# Patient Record
Sex: Female | Born: 1970 | Race: Black or African American | Hispanic: No | Marital: Married | Smoking: Never smoker
Health system: Southern US, Community
[De-identification: ages and names within clinical notes are randomized; demographics above are authoritative.]

## PROBLEM LIST (undated history)

## (undated) DIAGNOSIS — N390 Urinary tract infection, site not specified: Secondary | ICD-10-CM

## (undated) DIAGNOSIS — D649 Anemia, unspecified: Secondary | ICD-10-CM

## (undated) DIAGNOSIS — D259 Leiomyoma of uterus, unspecified: Secondary | ICD-10-CM

## (undated) DIAGNOSIS — Z800 Family history of malignant neoplasm of digestive organs: Secondary | ICD-10-CM

## (undated) DIAGNOSIS — O1490 Unspecified pre-eclampsia, unspecified trimester: Secondary | ICD-10-CM

## (undated) DIAGNOSIS — Z8041 Family history of malignant neoplasm of ovary: Secondary | ICD-10-CM

## (undated) HISTORY — DX: Family history of malignant neoplasm of digestive organs: Z80.0

## (undated) HISTORY — PX: PTOSIS REPAIR: SHX6568

## (undated) HISTORY — DX: Family history of malignant neoplasm of ovary: Z80.41

## (undated) HISTORY — PX: EYE SURGERY: SHX253

## (undated) HISTORY — PX: TONSILLECTOMY: SUR1361

---

## 2009-06-26 ENCOUNTER — Inpatient Hospital Stay (HOSPITAL_COMMUNITY): Admission: AD | Admit: 2009-06-26 | Discharge: 2009-06-26 | Payer: Self-pay | Admitting: Obstetrics & Gynecology

## 2009-06-29 ENCOUNTER — Ambulatory Visit (HOSPITAL_COMMUNITY): Admission: RE | Admit: 2009-06-29 | Discharge: 2009-06-29 | Payer: Self-pay | Admitting: Obstetrics & Gynecology

## 2009-07-28 ENCOUNTER — Ambulatory Visit (HOSPITAL_COMMUNITY): Admission: RE | Admit: 2009-07-28 | Discharge: 2009-07-28 | Payer: Self-pay | Admitting: Obstetrics & Gynecology

## 2009-08-04 ENCOUNTER — Ambulatory Visit: Payer: Self-pay | Admitting: Obstetrics and Gynecology

## 2009-08-04 ENCOUNTER — Encounter (INDEPENDENT_AMBULATORY_CARE_PROVIDER_SITE_OTHER): Payer: Self-pay | Admitting: Emergency Medicine

## 2009-08-04 ENCOUNTER — Emergency Department (HOSPITAL_COMMUNITY): Admission: EM | Admit: 2009-08-04 | Discharge: 2009-08-04 | Payer: Self-pay | Admitting: Emergency Medicine

## 2009-08-04 ENCOUNTER — Encounter: Payer: Self-pay | Admitting: Obstetrics & Gynecology

## 2009-08-04 ENCOUNTER — Ambulatory Visit: Payer: Self-pay | Admitting: Vascular Surgery

## 2009-08-06 ENCOUNTER — Ambulatory Visit (HOSPITAL_COMMUNITY): Admission: RE | Admit: 2009-08-06 | Discharge: 2009-08-06 | Payer: Self-pay | Admitting: Obstetrics & Gynecology

## 2009-08-29 ENCOUNTER — Inpatient Hospital Stay (HOSPITAL_COMMUNITY): Admission: AD | Admit: 2009-08-29 | Discharge: 2009-08-29 | Payer: Self-pay | Admitting: Obstetrics and Gynecology

## 2009-09-01 DIAGNOSIS — R079 Chest pain, unspecified: Secondary | ICD-10-CM

## 2009-09-01 DIAGNOSIS — I10 Essential (primary) hypertension: Secondary | ICD-10-CM | POA: Insufficient documentation

## 2009-09-09 ENCOUNTER — Ambulatory Visit: Payer: Self-pay | Admitting: Cardiology

## 2009-11-05 ENCOUNTER — Inpatient Hospital Stay (HOSPITAL_COMMUNITY): Admission: RE | Admit: 2009-11-05 | Discharge: 2009-11-08 | Payer: Self-pay | Admitting: Obstetrics & Gynecology

## 2010-05-25 NOTE — Assessment & Plan Note (Signed)
Summary: np6/[redacted] weeks pregnant/chest pain/htn/jml   Visit Type:  new pt visit Referring Provider:  Waynard Reeds Primary Provider:  Dr. Laurine Blazer .[facility] KitchenMar[facility]  CC:  chest pain....edema/ankles...sob due to [redacted] wks gestation.  History of Present Illness: [name] is referred today by Dr. Dareen Piano for evaluation of chest discomfort, lower extremity edema, and hypertension.  She is currently [redacted] weeks pregnant. She developed hypertension fairly early in pregnancy. She was treated with Procardia and then labetalol. She is currently on labetalol 800 mg 3 times a day and Procardia XL 30 mg a day. She says without Procardia her blood pressure went right back up.  She had some chest discomfort in the center of her chest with movement and with breathing back in April and early May. At that time she had an EKG and a chest x-ray. Looking at the handwritten notes I do not see any specific notes after that.  She had no fever, chills, productive cough or hemoptysis.  She's had progressive problems with lower extremity edema. She only developed the hypertension and the edema after being pregnant.  She does not smoke and does not drink heavily.  Preventive Screening-Counseling & Management  Alcohol-Tobacco     Smoking Status: never  Caffeine-Diet-Exercise     Does Patient Exercise: no      Drug Use:  no.    Current Medications (verified): 1)  Prenatal Vitamins 0.8 Mg Tabs (Prenatal Multivit-Min-Fe-Fa) .Marland Kitchen.. 1 Tab Once Daily 2)  Labetalol Hcl 200 Mg Tabs (Labetalol Hcl) .... 4 Tabs Three Times A Day 3)  Procardia Xl 30 Mg Xr24h-Tab (Nifedipine) .Marland Kitchen.. 1 Tab Once Daily  Allergies (verified): 1)  ! Codeine  Past History:  Past Medical History: Last updated: 09/01/2009 CHEST PAIN (ICD-786.50) HYPERTENSION (ICD-401.9)  Family History: Last updated: 09/01/2009 Fx h/o heart disease Family History of Hypertension:  Mother: anemia Family History of Diabetes:  Fx h/o thyroid  disfunction Fx h/o seizure disorder Family History of Cancer:  Fx h/o psychiatric disease  Social History: Last updated: 09/09/2009 Married  Alcohol Use - yes..wine Tobacco Use - No.  Regular Exercise - no Drug Use - no Full Time  Risk Factors: Exercise: no (09/09/2009)  Risk Factors: Smoking Status: never (09/09/2009)  Past Surgical History: Tonsillectomy Eye surgery x 2   Social History: Married  Alcohol Use - yes..wine Tobacco Use - No.  Regular Exercise - no Drug Use - no Full Time Smoking Status:  never Does Patient Exercise:  no Drug Use:  no  Review of Systems       negative other than history of present illness  Vital Signs:  Patient profile:   40 year old female Height:      73 inches Weight:      291 pounds BMI:     38.53 Pulse rate:   66 / minute Pulse rhythm:   regular BP sitting:   126 / 80  (left arm) Cuff size:   large  Vitals Entered By: Danielle Rankin, CMA (Sep 09, 2009 4:34 PM)  Physical Exam  General:  obese.  very pleasant, no acute distressobese.   Head:  normocephalic and atraumatic Eyes:  PERRLA/EOM intact; conjunctiva and lids normal. Mouth:  Teeth, gums and palate normal. Oral mucosa normal. Neck:  Neck supple, no JVD. No masses, thyromegaly or abnormal cervical nodes. Chest [name]:  no deformities or breast masses noted Lungs:  Clear bilaterally to auscultation and percussion. no rub Heart:  Pdifficult to appreciate, soft S1-S2 splits with inspiration. No  rub Abdomen:  gravida, positive bowel sounds Msk:  Back normal, normal gait. Muscle strength and tone normal. Pulses:  pulses normal in all 4 extremities Extremities:  2+ left pedal edema and 2+ right pedal edema.  ncalf tenderness or signs of DVT2+ left pedal edema and 2+ right pedal edema.   Neurologic:  Alert and oriented x 3. Skin:  Intact without lesions or rashes. Psych:  Normal affect.   EKG  Procedure date:  09/09/2009  Findings:      normal sinus rhythm, normal  EKG  Impression & Recommendations:  Problem # 1:  CHEST PAIN (ICD-786.50) Her chest discomfort sounds musculoskeletal. There no symptoms to suggest a pulmonary embolus and no signs on exam. Her chest x-ray was normal. I did not hear a rub either pleural or cardiac. Reassurance given. It has resolved not occurring in several weeks. Her updated medication list for this problem includes:    Labetalol Hcl 200 Mg Tabs (Labetalol hcl) .Marland KitchenMarland KitchenMarland KitchenMarland Kitchen 4 tabs three times a day    Procardia Xl 30 Mg Xr24h-tab (Nifedipine) .Marland Kitchen... 1 tab once daily  Orders: EKG w/ Interpretation (93000)  Problem # 2:  HYPERTENSION (ICD-401.9) I have asked her to keep a close watch on her blood pressure obviously after the time being to stay on both labetalol and Procardia. The Procardia is clearly contributing to the edema but is probably something she'll have to put up with. She is very conscientious and well versed on this matter. She is being very careful sodium. Hopefully her hypertension will resolve after pregnancy. Her updated medication list for this problem includes:    Labetalol Hcl 200 Mg Tabs (Labetalol hcl) .Marland KitchenMarland KitchenMarland KitchenMarland Kitchen 4 tabs three times a day    Procardia Xl 30 Mg Xr24h-tab (Nifedipine) .Marland Kitchen... 1 tab once daily  Patient Instructions: 1)  Your physician recommends that you schedule a follow-up appointment in: AS NEEDED 2)  Your physician recommends that you continue on your current medications as directed. Please refer to the Current Medication list given to you today.

## 2010-07-10 LAB — CBC
Platelets: 230 10*3/uL (ref 150–400)
RDW: 15 % (ref 11.5–15.5)

## 2010-07-11 LAB — LACTATE DEHYDROGENASE: LDH: 112 U/L (ref 94–250)

## 2010-07-11 LAB — CBC
HCT: 34.3 % — ABNORMAL LOW (ref 36.0–46.0)
MCH: 31.8 pg (ref 26.0–34.0)
MCV: 93.3 fL (ref 78.0–100.0)
Platelets: 277 10*3/uL (ref 150–400)
RBC: 3.67 MIL/uL — ABNORMAL LOW (ref 3.87–5.11)
WBC: 9.2 10*3/uL (ref 4.0–10.5)

## 2010-07-11 LAB — COMPREHENSIVE METABOLIC PANEL
Albumin: 2.9 g/dL — ABNORMAL LOW (ref 3.5–5.2)
Creatinine, Ser: 0.63 mg/dL (ref 0.4–1.2)
GFR calc Af Amer: >60 mL/min (ref >60)

## 2010-07-11 LAB — URIC ACID: Uric Acid, Serum: 4.4 mg/dL (ref 2.4–7.0)

## 2010-07-11 LAB — RPR: RPR Ser Ql: NONREACTIVE

## 2010-07-13 LAB — CBC
MCV: 90.6 fL (ref 78.0–100.0)
Platelets: 280 10*3/uL (ref 150–400)
WBC: 7.2 10*3/uL (ref 4.0–10.5)

## 2010-07-13 LAB — COMPREHENSIVE METABOLIC PANEL
ALT: 14 U/L (ref 0–35)
AST: 16 U/L (ref 0–37)
Albumin: 2.8 g/dL — ABNORMAL LOW (ref 3.5–5.2)
Alkaline Phosphatase: 61 U/L (ref 39–117)
CO2: 22 mEq/L (ref 19–32)
Calcium: 9.1 mg/dL (ref 8.4–10.5)
Chloride: 107 mEq/L (ref 96–112)
Potassium: 3.3 mEq/L — ABNORMAL LOW (ref 3.5–5.1)
Sodium: 135 mEq/L (ref 135–145)

## 2010-07-13 LAB — URINALYSIS, ROUTINE W REFLEX MICROSCOPIC
Ketones, ur: NEGATIVE mg/dL
Specific Gravity, Urine: <1.005 — ABNORMAL LOW (ref 1.005–1.030)

## 2010-07-13 LAB — URINE MICROSCOPIC-ADD ON

## 2010-07-13 LAB — TROPONIN I: Troponin I: 0.05 ng/mL (ref 0.00–0.06)

## 2010-07-13 LAB — CK TOTAL AND CKMB (NOT AT ARMC)
CK, MB: 1.2 ng/mL (ref 0.3–4.0)
Relative Index: INVALID (ref 0.0–2.5)

## 2010-07-14 LAB — CBC
HCT: 32.7 % — ABNORMAL LOW (ref 36.0–46.0)
Hemoglobin: 11.1 g/dL — ABNORMAL LOW (ref 12.0–15.0)
MCHC: 34.6 g/dL (ref 30.0–36.0)
MCV: 90.9 fL (ref 78.0–100.0)
Platelets: 263 10*3/uL (ref 150–400)
WBC: 8.1 10*3/uL (ref 4.0–10.5)
WBC: 9 10*3/uL (ref 4.0–10.5)

## 2010-07-14 LAB — DIFFERENTIAL
Basophils Absolute: 0 10*3/uL (ref 0.0–0.1)
Eosinophils Relative: 1 % (ref 0–5)
Lymphocytes Relative: 17 % (ref 12–46)
Monocytes Relative: 8 % (ref 3–12)

## 2010-07-14 LAB — BASIC METABOLIC PANEL
BUN: 4 mg/dL — ABNORMAL LOW (ref 6–23)
CO2: 23 mEq/L (ref 19–32)
GFR calc non Af Amer: >60 mL/min (ref >60)
Glucose, Bld: 81 mg/dL (ref 70–99)
Sodium: 137 mEq/L (ref 135–145)

## 2010-07-14 LAB — POCT CARDIAC MARKERS
Myoglobin, poc: 62.7 ng/mL (ref 12–200)
Troponin i, poc: <0.05 ng/mL (ref 0.00–0.09)

## 2011-04-16 ENCOUNTER — Emergency Department (HOSPITAL_COMMUNITY)
Admission: EM | Admit: 2011-04-16 | Discharge: 2011-04-16 | Discharge status: Against Medical Advice | Payer: 59 | Attending: Emergency Medicine | Admitting: Emergency Medicine

## 2011-04-16 DIAGNOSIS — H9209 Otalgia, unspecified ear: Secondary | ICD-10-CM | POA: Insufficient documentation

## 2011-04-16 HISTORY — DX: Unspecified pre-eclampsia, unspecified trimester: O14.90

## 2011-04-16 NOTE — ED Notes (Signed)
Pt c/o earache x 4 days

## 2011-04-17 ENCOUNTER — Encounter (HOSPITAL_COMMUNITY): Payer: Self-pay | Admitting: *Deleted

## 2011-04-17 ENCOUNTER — Emergency Department (INDEPENDENT_AMBULATORY_CARE_PROVIDER_SITE_OTHER)
Admission: EM | Admit: 2011-04-17 | Discharge: 2011-04-17 | Disposition: A | Discharge status: Home / Self Care | Payer: 59 | Source: Home / Self Care | Attending: Emergency Medicine | Admitting: Emergency Medicine

## 2011-04-17 DIAGNOSIS — H60399 Other infective otitis externa, unspecified ear: Secondary | ICD-10-CM

## 2011-04-17 DIAGNOSIS — H6090 Unspecified otitis externa, unspecified ear: Secondary | ICD-10-CM

## 2011-04-17 MED ORDER — NEOMYCIN-POLYMYXIN-HC 3.5-10000-1 OT SUSP: 4.0000 [drp] | Freq: Three times a day (TID) | OTIC | Status: AC

## 2011-04-17 NOTE — ED Provider Notes (Signed)
History     CSN: [id number]  Arrival date & time 04/17/11  1551   First MD Initiated Contact with Patient 04/17/11 1616      Chief Complaint  Patient presents with  . Otalgia    (Consider location/radiation/quality/duration/timing/severity/associated sxs/prior treatment) HPI Comments: [name] is a 40 year old female who has had a six-day history of right ear pain. The pain is described as a throbbing pain that comes and goes. She denies any drainage from the ear. She's had no fever or chills. She has had slight nasal congestion but no rhinorrhea or sore throat. She has had some postnasal drip but no coughing or wheezing. She denies a prior history of urine infections.  Patient is a 41 y.o. female presenting with ear pain.  Otalgia Pertinent negatives include no rhinorrhea, no sore throat, no abdominal pain, no diarrhea, no vomiting, no cough and no rash.    Past Medical History  Diagnosis Date  . Preeclampsia     History reviewed. No pertinent past surgical history.  History reviewed. No pertinent family history.  History  Substance Use Topics  . Smoking status: Former Games developer  . Smokeless tobacco: Not on file  . Alcohol Use: Yes     occasional    OB History    Grav Para Term Preterm Abortions TAB SAB Ect Mult Living                  Review of Systems  Constitutional: Negative for fever, chills and fatigue.  HENT: Positive for ear pain. Negative for congestion, sore throat, rhinorrhea, sneezing, neck stiffness, voice change and postnasal drip.   Eyes: Negative for pain, discharge and redness.  Respiratory: Negative for cough, chest tightness, shortness of breath and wheezing.   Gastrointestinal: Negative for nausea, vomiting, abdominal pain and diarrhea.  Skin: Negative for rash.    Allergies  Codeine  Home Medications   Current Outpatient Rx  Name Route Sig Dispense Refill  . NEOMYCIN-POLYMYXIN-HC 3.5-10000-1 OT SUSP Otic Place 4 drops in ear(s) 3 (three)  times daily. 7.5 mL 0    BP 140/85  Pulse 67  Temp(Src) 97.5 F (36.4 C) (Oral)  Resp 20  SpO2 100%  LMP 03/31/2011  Physical Exam  Nursing note and vitals reviewed. Constitutional: She appears well-developed and well-nourished. No distress.  HENT:  Head: Normocephalic and atraumatic.  Left Ear: External ear normal.  Nose: Nose normal.  Mouth/Throat: Oropharynx is clear and moist. No oropharyngeal exudate.       The right TM is normal. The ear canal is slightly erythematous. There is no swelling or exudate. The left TM and canal are normal.  Eyes: Conjunctivae and EOM are normal. Pupils are equal, round, and reactive to light. Right eye exhibits no discharge. Left eye exhibits no discharge.  Neck: Normal range of motion. Neck supple.  Cardiovascular: Normal rate, regular rhythm and normal heart sounds.   Pulmonary/Chest: Effort normal and breath sounds normal. No stridor. No respiratory distress. She has no wheezes. She has no rales. She exhibits no tenderness.  Lymphadenopathy:    She has no cervical adenopathy.  Skin: Skin is warm and dry. No rash noted. She is not diaphoretic.    ED Course  Procedures (including critical care time)  Labs Reviewed - No data to display No results found.   1. Otitis externa       MDM  She has otitis externa. Will treat with Cortisporin Otic suspension.        Roque Lias,  MD 04/17/11 1805

## 2011-04-17 NOTE — ED Notes (Signed)
C/O right earache x 5 days.  Denies fevers or cold sxs.  Has not taken any measures to help alleviate pain.

## 2012-01-21 IMAGING — US US OB DETAIL+14 WK
1 series · 18 of 28 positions shown · non-contrast
Comparison: none

OBSTETRICAL ULTRASOUND:
 This ultrasound was performed in The [HOSPITAL], and the [facility] report will be stored to [REDACTED] PACS.  This report is also available in [HOSPITAL]?s accessANYware.

[Series 1: us ob detail+14 wk · 18 of 91 slices shown]
[im 1/91]
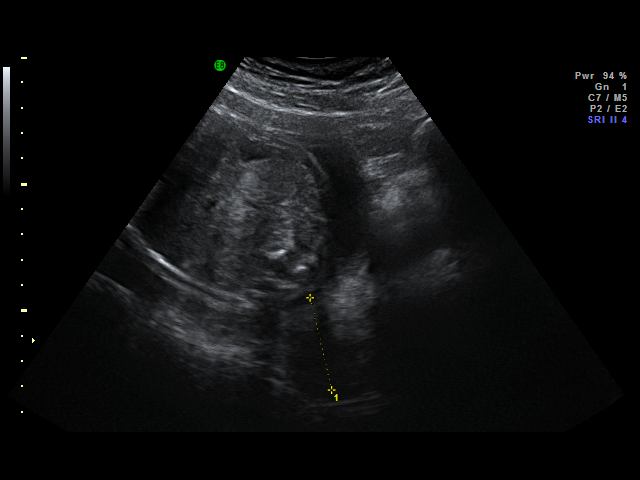
[im 7/91]
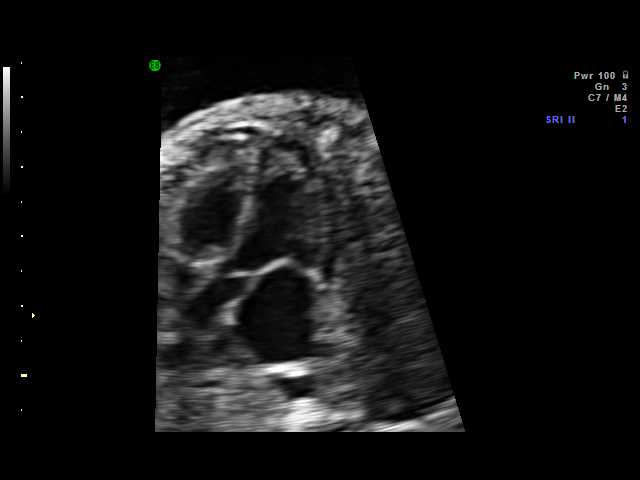
[im 11/91]
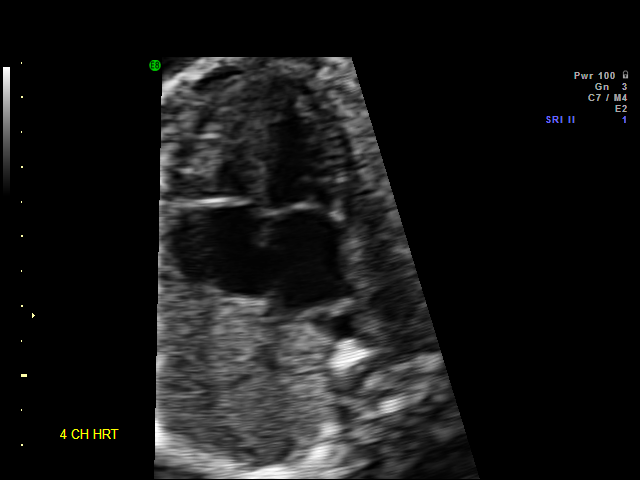
[im 17/91]
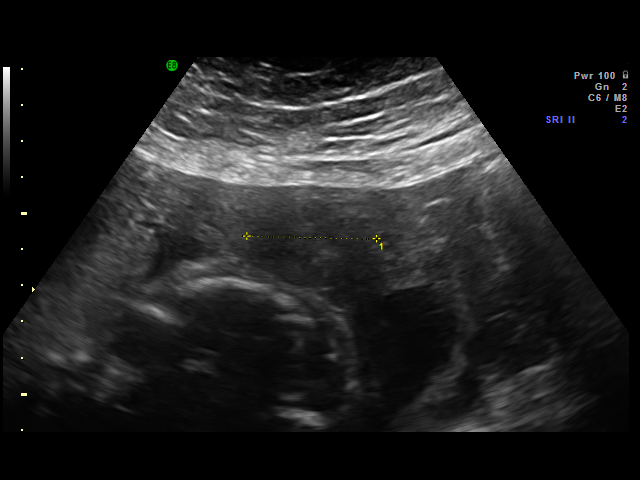
[im 24/91]
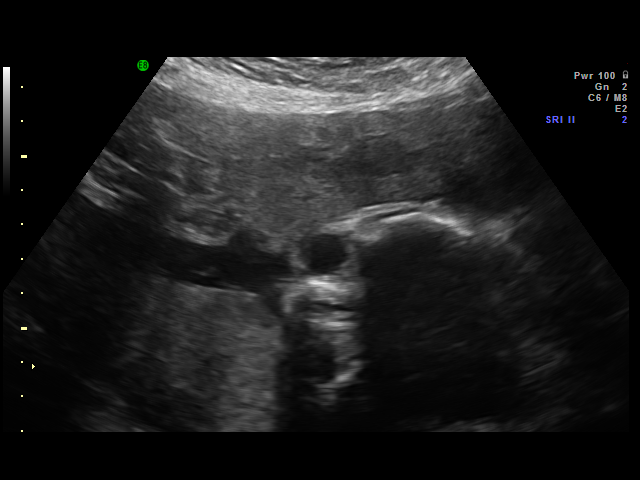
[im 27/91]
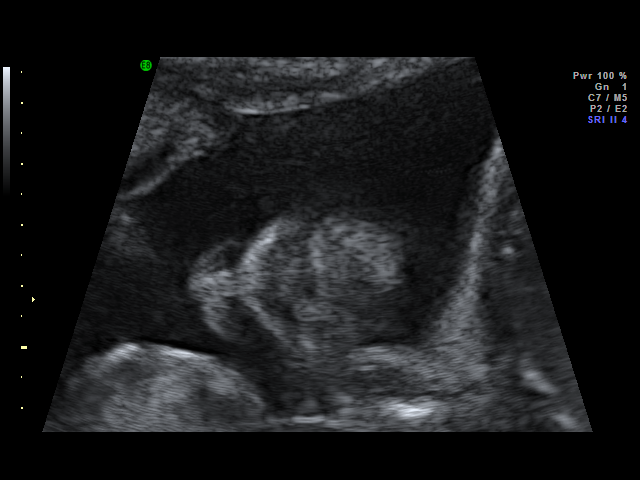
[im 34/91]
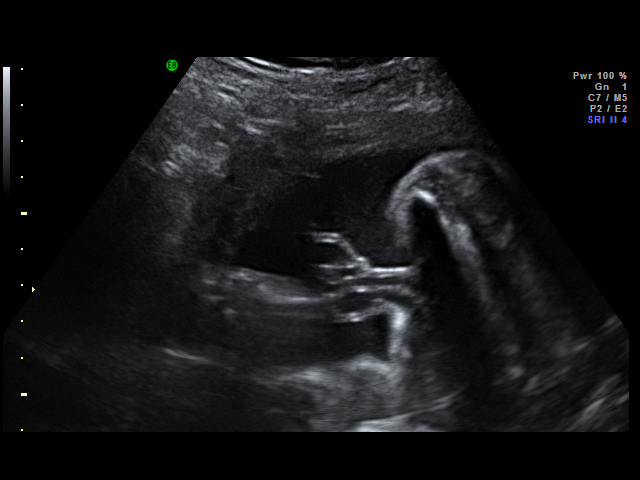
[im 37/91]
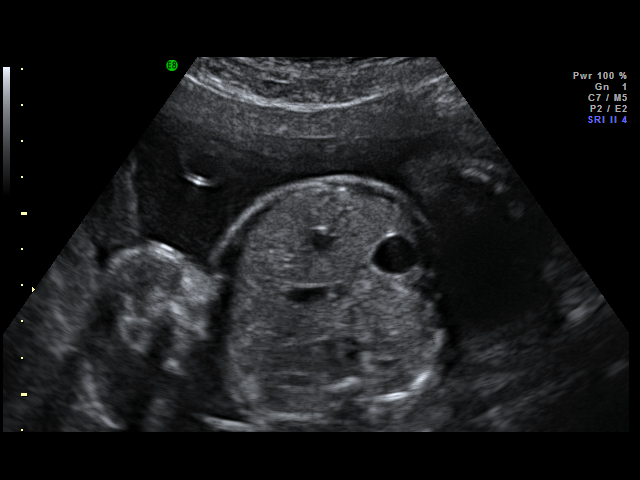
[im 44/91]
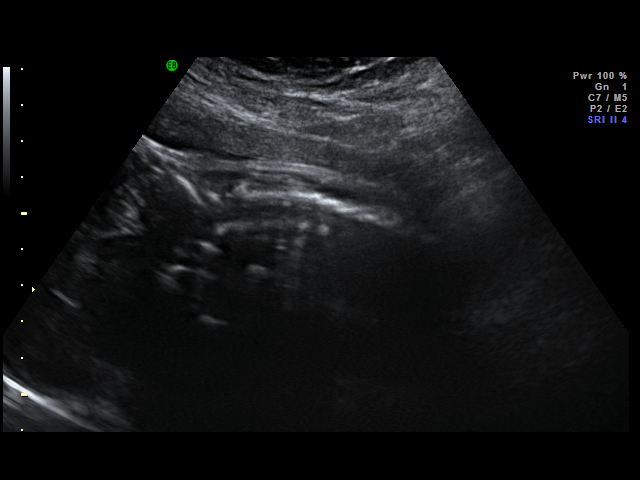
[im 47/91]
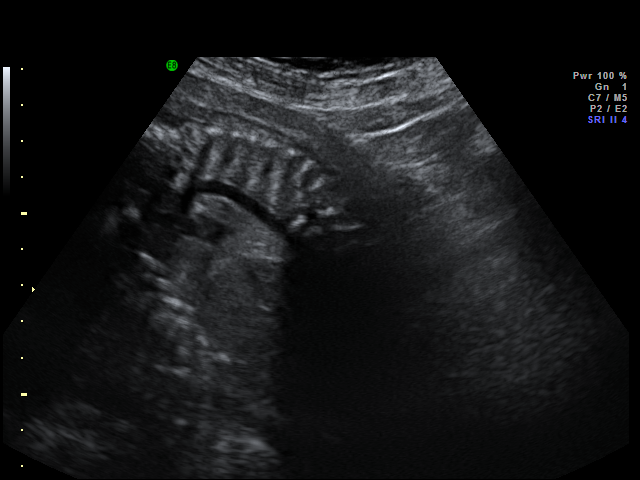
[im 54/91]
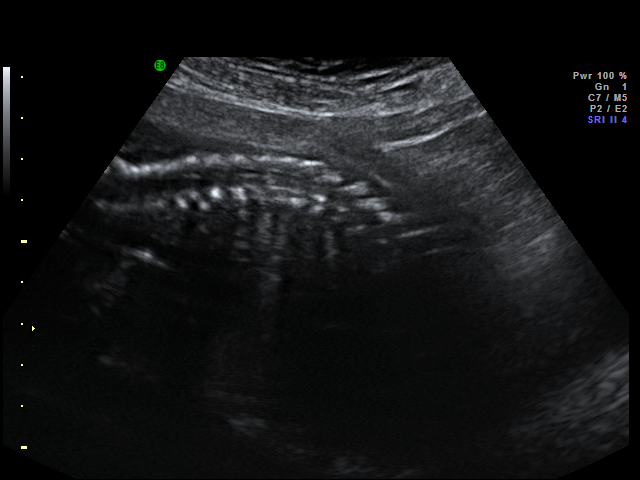
[im 57/91]
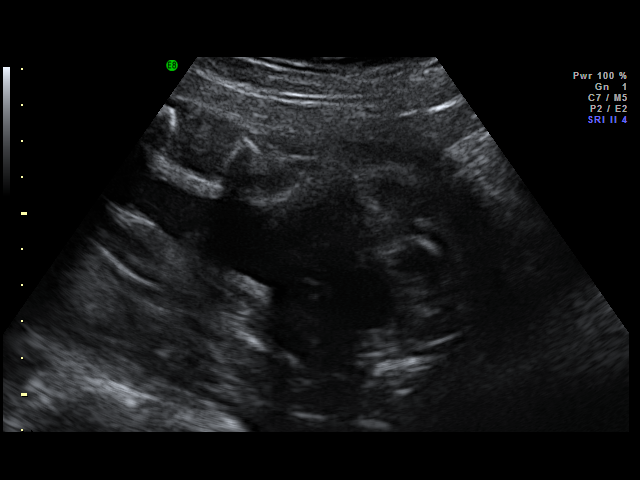
[im 64/91]
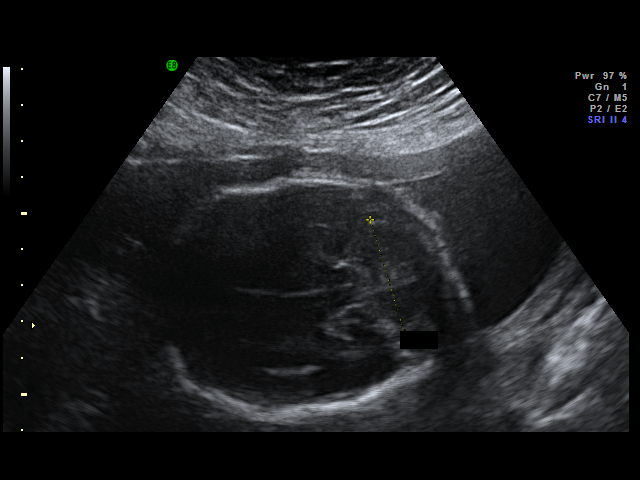
[im 71/91]
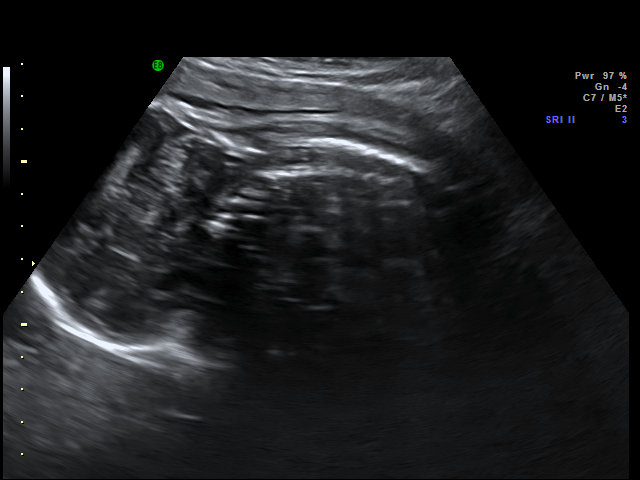
[im 74/91]
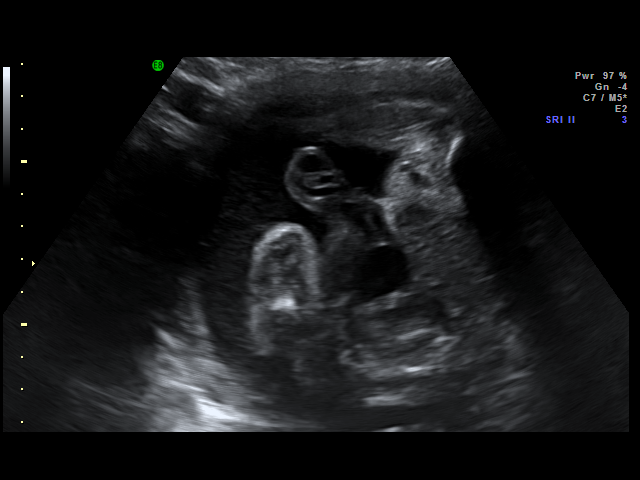
[im 81/91]
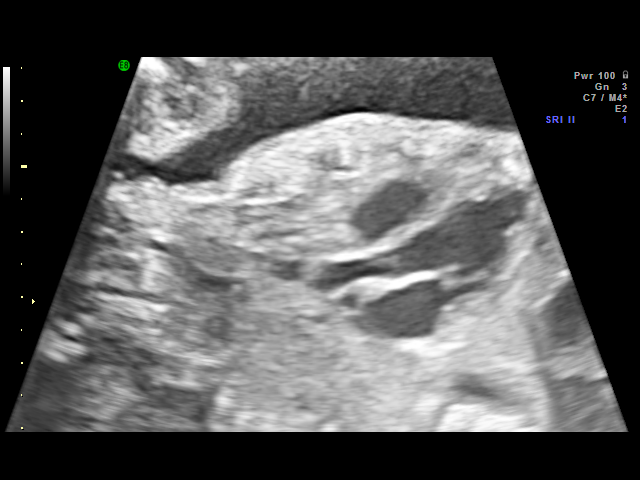
[im 84/91]
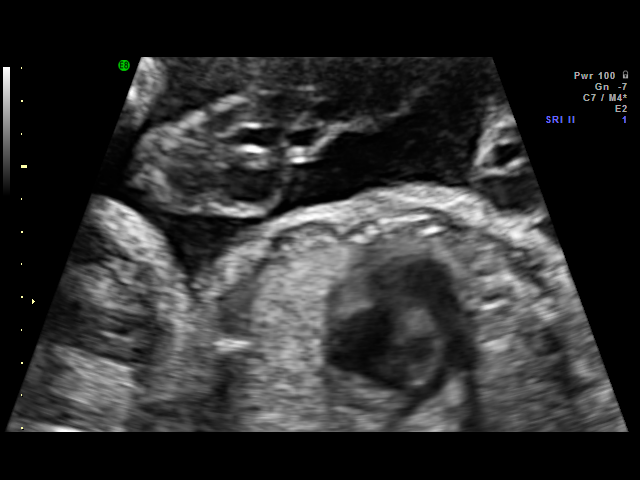
[im 91/91]
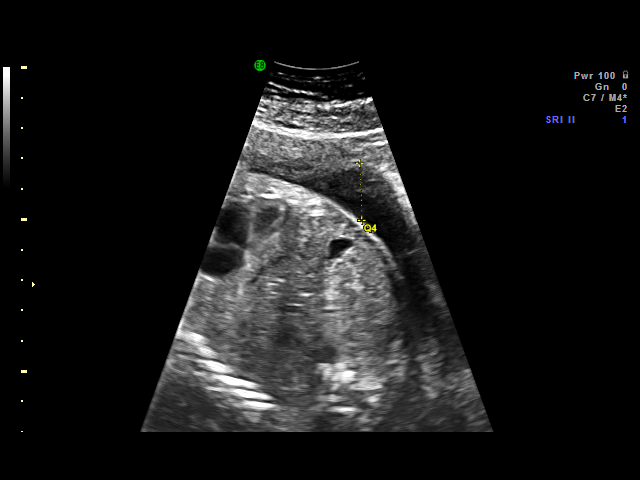

[18 of 28 positions shown; findings below may reference images not displayed]

IMPRESSION: [facility] has also been faxed to the ordering physician.

## 2012-10-10 ENCOUNTER — Other Ambulatory Visit: Payer: Self-pay | Admitting: Family Medicine

## 2012-10-10 DIAGNOSIS — N63 Unspecified lump in unspecified breast: Secondary | ICD-10-CM

## 2012-10-22 ENCOUNTER — Other Ambulatory Visit: Payer: 59

## 2013-04-22 ENCOUNTER — Ambulatory Visit
Admission: RE | Admit: 2013-04-22 | Discharge: 2013-04-22 | Disposition: A | Discharge status: Home / Self Care | Payer: 59 | Source: Ambulatory Visit | Attending: Family Medicine | Admitting: Family Medicine

## 2013-04-22 ENCOUNTER — Other Ambulatory Visit: Payer: Self-pay | Admitting: Family Medicine

## 2013-04-22 DIAGNOSIS — E049 Nontoxic goiter, unspecified: Secondary | ICD-10-CM

## 2013-04-23 ENCOUNTER — Other Ambulatory Visit: Payer: Self-pay | Admitting: Family Medicine

## 2013-04-23 DIAGNOSIS — E041 Nontoxic single thyroid nodule: Secondary | ICD-10-CM

## 2013-05-02 ENCOUNTER — Ambulatory Visit
Admission: RE | Admit: 2013-05-02 | Discharge: 2013-05-02 | Disposition: A | Discharge status: Home / Self Care | Payer: 59 | Source: Ambulatory Visit | Attending: Family Medicine | Admitting: Family Medicine

## 2013-05-02 ENCOUNTER — Other Ambulatory Visit (HOSPITAL_COMMUNITY)
Admission: RE | Admit: 2013-05-02 | Discharge: 2013-05-02 | Disposition: A | Discharge status: Home / Self Care | Payer: 59 | Source: Ambulatory Visit | Attending: Interventional Radiology | Admitting: Interventional Radiology

## 2013-05-02 DIAGNOSIS — E041 Nontoxic single thyroid nodule: Secondary | ICD-10-CM | POA: Insufficient documentation

## 2013-05-08 ENCOUNTER — Other Ambulatory Visit: Payer: 59

## 2013-12-06 ENCOUNTER — Ambulatory Visit: Payer: Self-pay | Admitting: Podiatry

## 2013-12-13 ENCOUNTER — Ambulatory Visit (INDEPENDENT_AMBULATORY_CARE_PROVIDER_SITE_OTHER): Payer: 59 | Admitting: Podiatry

## 2013-12-13 ENCOUNTER — Encounter: Payer: Self-pay | Admitting: Podiatry

## 2013-12-13 ENCOUNTER — Ambulatory Visit (INDEPENDENT_AMBULATORY_CARE_PROVIDER_SITE_OTHER): Payer: 59

## 2013-12-13 VITALS — BP 111/64 | HR 76 | Resp 16 | Ht 72.0 in | Wt 255.0 lb

## 2013-12-13 DIAGNOSIS — M2012 Hallux valgus (acquired), left foot: Secondary | ICD-10-CM

## 2013-12-13 DIAGNOSIS — M79609 Pain in unspecified limb: Secondary | ICD-10-CM

## 2013-12-13 DIAGNOSIS — M79673 Pain in unspecified foot: Secondary | ICD-10-CM

## 2013-12-13 DIAGNOSIS — M2010 Hallux valgus (acquired), unspecified foot: Secondary | ICD-10-CM

## 2013-12-13 DIAGNOSIS — M722 Plantar fascial fibromatosis: Secondary | ICD-10-CM

## 2013-12-13 DIAGNOSIS — M7662 Achilles tendinitis, left leg: Secondary | ICD-10-CM

## 2013-12-13 DIAGNOSIS — M7660 Achilles tendinitis, unspecified leg: Secondary | ICD-10-CM

## 2013-12-13 NOTE — Patient Instructions (Addendum)
Plantar Fasciitis (Heel Spur Syndrome) with Rehab The plantar fascia is a fibrous, ligament-like, soft-tissue structure that spans the bottom of the foot. Plantar fasciitis is a condition that causes pain in the foot due to inflammation of the tissue. SYMPTOMS   Pain and tenderness on the underneath side of the foot.  Pain that worsens with standing or walking. CAUSES  Plantar fasciitis is caused by irritation and injury to the plantar fascia on the underneath side of the foot. Common mechanisms of injury include:  Direct trauma to bottom of the foot.  Damage to a small nerve that runs under the foot where the main fascia attaches to the heel bone.  Stress placed on the plantar fascia due to bone spurs. RISK INCREASES WITH:   Activities that place stress on the plantar fascia (running, jumping, pivoting, or cutting).  Poor strength and flexibility.  Improperly fitted shoes.  Tight calf muscles.  Flat feet.  Failure to warm-up properly before activity.  Obesity. PREVENTION  Warm up and stretch properly before activity.  Allow for adequate recovery between workouts.  Maintain physical fitness:  Strength, flexibility, and endurance.  Cardiovascular fitness.  Maintain a health body weight.  Avoid stress on the plantar fascia.  Wear properly fitted shoes, including arch supports for individuals who have flat feet.  PROGNOSIS  If treated properly, then the symptoms of plantar fasciitis usually resolve without surgery. However, occasionally surgery is necessary.  RELATED COMPLICATIONS   Recurrent symptoms that may result in a chronic condition.  Problems of the lower back that are caused by compensating for the injury, such as limping.  Pain or weakness of the foot during push-off following surgery.  Chronic inflammation, scarring, and partial or complete fascia tear, occurring more often from repeated injections.  TREATMENT  Treatment initially involves the  use of ice and medication to help reduce pain and inflammation. The use of strengthening and stretching exercises may help reduce pain with activity, especially stretches of the Achilles tendon. These exercises may be performed at home or with a therapist. Your caregiver may recommend that you use heel cups of arch supports to help reduce stress on the plantar fascia. Occasionally, corticosteroid injections are given to reduce inflammation. If symptoms persist for greater than 6 months despite non-surgical (conservative), then surgery may be recommended.   MEDICATION   If pain medication is necessary, then nonsteroidal anti-inflammatory medications, such as aspirin and ibuprofen, or other minor pain relievers, such as acetaminophen, are often recommended.  Do not take pain medication within 7 days before surgery.  Prescription pain relievers may be given if deemed necessary by your caregiver. Use only as directed and only as much as you need.  Corticosteroid injections may be given by your caregiver. These injections should be reserved for the most serious cases, because they may only be given a certain number of times.  HEAT AND COLD  Cold treatment (icing) relieves pain and reduces inflammation. Cold treatment should be applied for 10 to 15 minutes every 2 to 3 hours for inflammation and pain and immediately after any activity that aggravates your symptoms. Use ice packs or massage the area with a piece of ice (ice massage).  Heat treatment may be used prior to performing the stretching and strengthening activities prescribed by your caregiver, physical therapist, or athletic trainer. Use a heat pack or soak the injury in warm water.  SEEK IMMEDIATE MEDICAL CARE IF:  Treatment seems to offer no benefit, or the condition worsens.  Any medications   produce adverse side effects.  EXERCISES- RANGE OF MOTION (ROM) AND STRETCHING EXERCISES - Plantar Fasciitis (Heel Spur Syndrome) These exercises  may help you when beginning to rehabilitate your injury. Your symptoms may resolve with or without further involvement from your physician, physical therapist or athletic trainer. While completing these exercises, remember:   Restoring tissue flexibility helps normal motion to return to the joints. This allows healthier, less painful movement and activity.  An effective stretch should be held for at least 30 seconds.  A stretch should never be painful. You should only feel a gentle lengthening or release in the stretched tissue.  RANGE OF MOTION - Toe Extension, Flexion  Sit with your right / left leg crossed over your opposite knee.  Grasp your toes and gently pull them back toward the top of your foot. You should feel a stretch on the bottom of your toes and/or foot.  Hold this stretch for 10 seconds.  Now, gently pull your toes toward the bottom of your foot. You should feel a stretch on the top of your toes and or foot.  Hold this stretch for 10 seconds. Repeat  times. Complete this stretch 3 times per day.   RANGE OF MOTION - Ankle Dorsiflexion, Active Assisted  Remove shoes and sit on a chair that is preferably not on a carpeted surface.  Place right / left foot under knee. Extend your opposite leg for support.  Keeping your heel down, slide your right / left foot back toward the chair until you feel a stretch at your ankle or calf. If you do not feel a stretch, slide your bottom forward to the edge of the chair, while still keeping your heel down.  Hold this stretch for 10 seconds. Repeat 3 times. Complete this stretch 2 times per day.   STRETCH  Gastroc, Standing  Place hands on wall.  Extend right / left leg, keeping the front knee somewhat bent.  Slightly point your toes inward on your back foot.  Keeping your right / left heel on the floor and your knee straight, shift your weight toward the wall, not allowing your back to arch.  You should feel a gentle stretch  in the right / left calf. Hold this position for 10 seconds. Repeat 3 times. Complete this stretch 2 times per day.  STRETCH  Soleus, Standing  Place hands on wall.  Extend right / left leg, keeping the other knee somewhat bent.  Slightly point your toes inward on your back foot.  Keep your right / left heel on the floor, bend your back knee, and slightly shift your weight over the back leg so that you feel a gentle stretch deep in your back calf.  Hold this position for 10 seconds. Repeat 3 times. Complete this stretch 2 times per day.  STRETCH  Gastrocsoleus, Standing  Note: This exercise can place a lot of stress on your foot and ankle. Please complete this exercise only if specifically instructed by your caregiver.   Place the ball of your right / left foot on a step, keeping your other foot firmly on the same step.  Hold on to the wall or a rail for balance.  Slowly lift your other foot, allowing your body weight to press your heel down over the edge of the step.  You should feel a stretch in your right / left calf.  Hold this position for 10 seconds.  Repeat this exercise with a slight bend in your right /   left knee. Repeat 3 times. Complete this stretch 2 times per day.   STRENGTHENING EXERCISES - Plantar Fasciitis (Heel Spur Syndrome)  These exercises may help you when beginning to rehabilitate your injury. They may resolve your symptoms with or without further involvement from your physician, physical therapist or athletic trainer. While completing these exercises, remember:   Muscles can gain both the endurance and the strength needed for everyday activities through controlled exercises.  Complete these exercises as instructed by your physician, physical therapist or athletic trainer. Progress the resistance and repetitions only as guided.  STRENGTH - Towel Curls  Sit in a chair positioned on a non-carpeted surface.  Place your foot on a towel, keeping your heel  on the floor.  Pull the towel toward your heel by only curling your toes. Keep your heel on the floor. Repeat 3 times. Complete this exercise 2 times per day.  STRENGTH - Ankle Inversion  Secure one end of a rubber exercise band/tubing to a fixed object (table, pole). Loop the other end around your foot just before your toes.  Place your fists between your knees. This will focus your strengthening at your ankle.  Slowly, pull your big toe up and in, making sure the band/tubing is positioned to resist the entire motion.  Hold this position for 10 seconds.  Have your muscles resist the band/tubing as it slowly pulls your foot back to the starting position. Repeat 3 times. Complete this exercises 2 times per day.  Document Released: 04/11/2005 Document Revised: 07/04/2011 Document Reviewed: 07/24/2008 [facility] Patient Information 2014 [place], [place]. Achilles Tendinitis Achilles tendinitis is inflammation of the tough, cord-like band that attaches the lower muscles of your leg to your heel (Achilles tendon). It is usually caused by overusing the tendon and joint involved.  CAUSES Achilles tendinitis can happen because of:  A sudden increase in exercise or activity (such as running).  Doing the same exercises or activities (such as jumping) over and over.  Not warming up calf muscles before exercising.  Exercising in shoes that are worn out or not made for exercise.  Having arthritis or a bone growth on the back of the heel bone. This can rub against the tendon and hurt the tendon. SIGNS AND SYMPTOMS The most common symptoms are:  Pain in the back of the leg, just above the heel. The pain usually gets worse with exercise and better with rest.  Stiffness or soreness in the back of the leg, especially in the morning.  Swelling of the skin over the Achilles tendon.  Trouble standing on tiptoe. Sometimes, an Achilles tendon tears (ruptures). Symptoms of an Achilles tendon rupture  can include:  Sudden, severe pain in the back of the leg.  Trouble putting weight on the foot or walking normally. DIAGNOSIS Achilles tendinitis will be diagnosed based on symptoms and a physical examination. An X-ray may be done to check if another condition is causing your symptoms. An MRI may be ordered if your health care provider suspects you may have completely torn your tendon, which is called an Achilles tendon rupture.  TREATMENT  Achilles tendinitis usually gets better over time. It can take weeks to months to heal completely. Treatment focuses on treating the symptoms and helping the injury heal. HOME CARE INSTRUCTIONS   Rest your Achilles tendon and avoid activities that cause pain.  Apply ice to the injured area:  Put ice in a plastic bag.  Place a towel between your skin and the bag.  Leave  the ice on for 20 minutes, 2-3 times a day  Try to avoid using the tendon (other than gentle range of motion) while the tendon is painful. Do not resume use until instructed by your health care provider. Then begin use gradually. Do not increase use to the point of pain. If pain does develop, decrease use and continue the above measures. Gradually increase activities that do not cause discomfort until you achieve normal use.  Do exercises to make your calf muscles stronger and more flexible. Your health care provider or physical therapist can recommend exercises for you to do.  Wrap your ankle with an elastic bandage or other wrap. This can help keep your tendon from moving too much. Your health care provider will show you how to wrap your ankle correctly.  Only take over-the-counter or prescription medicines for pain, discomfort, or fever as directed by your health care provider. SEEK MEDICAL CARE IF:   Your pain and swelling increase or pain is uncontrolled with medicines.  You develop new, unexplained symptoms or your symptoms get worse.  You are unable to move your toes or  foot.  You develop warmth and swelling in your foot.  You have an unexplained temperature. MAKE SURE YOU:   Understand these instructions.  Will watch your condition.  Will get help right away if you are not doing well or get worse. Document Released: 01/19/2005 Document Revised: 01/30/2013 Document Reviewed: 11/21/2012 [facility] Patient Information 2015 [place], [place]. This information is not intended to replace advice given to you by your health care provider. Make sure you discuss any questions you have with your health care provider.

## 2013-12-13 NOTE — Progress Notes (Signed)
   Subjective:    Patient ID: [name], female    DOB: 1970-08-10, 43 y.o.   MRN: [id number]  HPI Comments: "I have some tender feet"  Patient c/o aching plantar arch and posterior heel bilateral, left over right, since 2011. She was pregnant and noticed pain increased with weight gain. She does have AM pain and after sitting for long periods. She did go to a podiatrist in 2011 and they made custom inserts. She just recently started wearing them. She has tried Ibuprofen.    Foot Pain      Review of Systems  All other systems reviewed and are negative.      Objective:   Physical Exam  Nursing note and vitals reviewed. Constitutional: She is oriented to person, place, and time. She appears well-developed and well-nourished.  Musculoskeletal: Normal range of motion. She exhibits no edema.  Dennis palpation of the left posterior lateral aspect of the heel at the insertion of the Achilles tendon. Mild discomfort on palpation the plantar medial tubercle of the calcaneus at the insertion of the plantar fascia. No pain along the course of the plantar fascia within the arch of the foot. Plantar fascia and Achilles tendon are both intact. She has a slightly higher increase in the medial arch height on the right compared to the left with a slightly inverted foot type on the right. Equinus bilaterally.   Neurological: She is alert and oriented to person, place, and time.  Vibratory sensation intact, protective sensation intact with the Semmes Weinstein monofilament.  DP/PT pulses palpable b/l. CRT < 3 sec.         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 -X-rays were obtained and reviewed with the patient. -Conservative versus surgical intervention was discussed the patient in detail including alternatives, risks, complications. Etiology discussed. -At this time she saw  him mild discomfort in the heel which is not currently present so therefore we will hold off on a steroid injection this time. -Dispensed night splint. -Discussed stretching exercises. -Ice -Continue with orthotics. -States that she has ibuprofen 600 mg at home at the tear that she can take that to help decrease inflammation as well -Discussed supportive shoe gear.  -Will followup in one month or sooner if any problems are to arise or if the symptoms change or worsen to

## 2014-01-10 ENCOUNTER — Encounter: Payer: Self-pay | Admitting: Podiatry

## 2014-01-10 ENCOUNTER — Ambulatory Visit (INDEPENDENT_AMBULATORY_CARE_PROVIDER_SITE_OTHER): Payer: 59 | Admitting: Podiatry

## 2014-01-10 VITALS — BP 127/85 | HR 62 | Resp 18

## 2014-01-10 DIAGNOSIS — M722 Plantar fascial fibromatosis: Secondary | ICD-10-CM

## 2014-01-10 DIAGNOSIS — M79609 Pain in unspecified limb: Secondary | ICD-10-CM

## 2014-01-10 DIAGNOSIS — M79673 Pain in unspecified foot: Secondary | ICD-10-CM

## 2014-01-10 MED ORDER — DICLOFENAC SODIUM 1 % TD GEL: 2.0000 g | Freq: Two times a day (BID) | TRANSDERMAL | Status: DC

## 2014-01-10 NOTE — Progress Notes (Signed)
   Subjective:    Patient ID: [name], female    DOB: 1970/10/19, 43 y.o.   MRN: [id number]  HPI  [name], 43 year old female, returns the office they for followup evaluation of plantar arch and posterior heel pain. She states this is her last appointment she should wearing her orthotics. At the time of starting to wear her orthotics she started noticing increasing pain to the arch of her foot and into the heels. She did not break them in. If the pain is worse in the morning or after sitting for periods time. She has not been doing her stretching exercises regularly. She said no acute changes since last appointment and no new complaints. Denies any recent trauma or injury to the area.    Review of Systems  Musculoskeletal:       Heel/arch pain  All other systems reviewed and are negative.      Objective:   Physical Exam AAO x3, NAD DP/PT pulses palpable 2/4 b/l. CRT < 3sec Protective sensation intact with Derrel Nip monofilament, vibratory sensation intact, Achilles tendon reflex intact. Tenderness to palpation over the plantar medial aspect of the bilateral heels near the insertion of the plantar fascia. No pinpoint bony tenderness and no pain with vibratory sensation. No pain along the posterior aspect of the calcaneus or with lateral compression b/l.  No pain along the course of the Achilles tendon or along the insertion of the Achilles tendon. High arch foot type b/l. Equinus bilaterally. No leg pain, swelling, warmth. No open lesions      Assessment & Plan:  43 year old female with bilateral plantar fasciitis. -Conservative versus surgical treatment were discussed including alternatives, risks, complications. -Discussed possible injection into the heels, plantar fascial brace seen, strapping, immobilization. At this time the patient would hold off on this.  -Prescribed Voltaren gel to apply to the effected area. -Continue ice to the effected area -Continue stretching  exercises -Discussed supportive shoe gear -Followup in 3 weeks or sooner if any problems arise or if there are any questions/concerns/change in symptoms. At next appointment, she is to bring in her orthotics to evaluate them and to see if there are any adjustments that need to be made.

## 2014-02-05 ENCOUNTER — Ambulatory Visit: Payer: 59 | Admitting: Podiatry

## 2014-02-07 ENCOUNTER — Encounter: Payer: Self-pay | Admitting: Podiatry

## 2014-02-07 ENCOUNTER — Ambulatory Visit (INDEPENDENT_AMBULATORY_CARE_PROVIDER_SITE_OTHER): Payer: 59 | Admitting: Podiatry

## 2014-02-07 VITALS — BP 132/67 | HR 68 | Resp 16

## 2014-02-07 DIAGNOSIS — M722 Plantar fascial fibromatosis: Secondary | ICD-10-CM

## 2014-02-07 DIAGNOSIS — M79673 Pain in unspecified foot: Secondary | ICD-10-CM

## 2014-02-07 NOTE — Progress Notes (Signed)
Patient ID: [name], female   DOB: [birth year], 43 y.o.   MRN: [id number]  Subjective: [name] returns to the office today for follow up evaluation of bilateral heel pain and pain in the arch of the foot. She states she has pain in the morning and is intermittent in nature. She states it does not hurt on a daily basis. She has been applying ice and stretching intermittently. She has been applying voltarn gel intermittently. She takes ibuprofen 600mg  for the pain. Denies any recent injury or trauma to the area. No other complaints at this time. She has brought in her old orthotics for evaluation. She states they do not help and believe they hurt her feet.   Objective:  AAO x3, NAD DP/PT pulses palpable bilaterally, CRT less than 3 seconds Protective sensation intact the Semmes Weinstein monofilament Mild tenderness to palpation of the plantar medial tubercle of the calcaneus bilaterally at the insertion the plantar fascia. Mild pain along the arch of the foot. Plantar fascia appears intact. No pain with lateral compression of the calcaneus or along the posterior aspect. No pain with vibratory sensation of pinpoint bony tenderness. Bilateral equinus. MMT 5/5, ROM WNL. No calf pain, swelling, warmth. No open lesion   Assessment: 43 year old for follow up evaluation of b/l plantar fasciitis and pain in the arch of the foot.  Plan: -Conservative vs. Surgical treatment discussed with the patient including all alternatives, risks, and complications. -Patient wishes to hold off on any steroid injections. -At this time discussed orthotic therapy. After they wish of orthotics it does appear that the right side is no fit into the arch of the foot. Also she states it is to harden uncomfortable for her. Significantly refurbished. She did not want to spend the money to have them redone.  -Recommended the patient go to [facility] to get much if orthotics and shoes. -Continue stretching exercises. -Continue  ice. -Continue Voltaren gel. -Followup as needed. Call the office with any questions, concerns or change in symptoms.

## 2014-05-13 ENCOUNTER — Other Ambulatory Visit: Payer: Self-pay | Admitting: Family Medicine

## 2014-05-13 DIAGNOSIS — E042 Nontoxic multinodular goiter: Secondary | ICD-10-CM

## 2014-05-20 ENCOUNTER — Ambulatory Visit
Admission: RE | Admit: 2014-05-20 | Discharge: 2014-05-20 | Disposition: A | Discharge status: Home / Self Care | Payer: 59 | Source: Ambulatory Visit | Attending: Family Medicine | Admitting: Family Medicine

## 2014-05-20 DIAGNOSIS — E042 Nontoxic multinodular goiter: Secondary | ICD-10-CM

## 2015-01-12 ENCOUNTER — Encounter: Payer: Self-pay | Admitting: Podiatry

## 2015-01-12 ENCOUNTER — Ambulatory Visit (INDEPENDENT_AMBULATORY_CARE_PROVIDER_SITE_OTHER): Admitting: Podiatry

## 2015-01-12 ENCOUNTER — Ambulatory Visit (INDEPENDENT_AMBULATORY_CARE_PROVIDER_SITE_OTHER)

## 2015-01-12 VITALS — BP 120/80 | HR 67 | Resp 16

## 2015-01-12 DIAGNOSIS — M79672 Pain in left foot: Secondary | ICD-10-CM

## 2015-01-12 DIAGNOSIS — M79671 Pain in right foot: Secondary | ICD-10-CM

## 2015-01-12 DIAGNOSIS — M722 Plantar fascial fibromatosis: Secondary | ICD-10-CM | POA: Diagnosis not present

## 2015-01-12 MED ORDER — TRIAMCINOLONE ACETONIDE 10 MG/ML IJ SUSP
10.0000 mg | Freq: Once | INTRAMUSCULAR | Status: AC
  Administered 2015-01-12: 10 mg

## 2015-01-12 NOTE — Progress Notes (Signed)
Subjective:     Patient ID: [name], female   DOB: Dec 24, 1970, 44 y.o.   MRN: [id number]  HPI patient presents with pain in the left heel stating that she has tried to increase exercise but the pain in her heel is causing her problems   Review of Systems     Objective:   Physical Exam Neurovascular status intact muscle strength adequate with discomfort plantar aspect left heel at the insertional point of the tendon the calcaneus and also slightly distal to this    Assessment:     Plantar fasciitis left with inflammation    Plan:     Reviewed continued physical therapy night splint usage and reinjected the plantar fascial left 3 mg Kenalog 5 mg Xylocaine and applied fascial brace to support the arch. Reappoint in the next several weeks

## 2015-01-12 NOTE — Patient Instructions (Signed)

## 2015-02-06 ENCOUNTER — Ambulatory Visit: Admitting: Podiatry

## 2015-05-13 ENCOUNTER — Ambulatory Visit (INDEPENDENT_AMBULATORY_CARE_PROVIDER_SITE_OTHER): Admitting: Podiatry

## 2015-05-13 ENCOUNTER — Encounter: Payer: Self-pay | Admitting: Podiatry

## 2015-05-13 ENCOUNTER — Ambulatory Visit (INDEPENDENT_AMBULATORY_CARE_PROVIDER_SITE_OTHER)

## 2015-05-13 DIAGNOSIS — M722 Plantar fascial fibromatosis: Secondary | ICD-10-CM | POA: Diagnosis not present

## 2015-05-13 DIAGNOSIS — M779 Enthesopathy, unspecified: Secondary | ICD-10-CM

## 2015-05-14 NOTE — Progress Notes (Signed)
Subjective:     Patient ID: [name], female   DOB: [birth year], 45 y.o.   MRN: [id number]  HPI patient is at some irritation in the right forefoot over left forefoot with irritation around the lesser digits and pain at a mild nature   Review of Systems     Objective:   Physical Exam Neurovascular status unchanged with patient having good muscle function and good digital perfusion and noted to have discomfort of a mild nature second and third toes right with mild rotation of the digits noted    Assessment:     Mild rotation of digits but no indications of severe pathology    Plan:     Explained that we cannot control the function of the digits but that they are functioning in an adequate fashion and I do not believe they will get worse and at this time with no pain I recommended wider shoes soft leather and just continuing to evaluate

## 2017-09-28 ENCOUNTER — Encounter: Payer: Self-pay | Admitting: Physical Therapy

## 2017-09-28 ENCOUNTER — Other Ambulatory Visit: Payer: Self-pay

## 2017-09-28 ENCOUNTER — Ambulatory Visit: Attending: Family Medicine | Admitting: Physical Therapy

## 2017-09-28 DIAGNOSIS — M6281 Muscle weakness (generalized): Secondary | ICD-10-CM | POA: Insufficient documentation

## 2017-09-28 DIAGNOSIS — M25651 Stiffness of right hip, not elsewhere classified: Secondary | ICD-10-CM | POA: Diagnosis present

## 2017-09-28 DIAGNOSIS — M25551 Pain in right hip: Secondary | ICD-10-CM | POA: Diagnosis not present

## 2017-09-28 NOTE — Therapy (Signed)
[facility] [address], [place], [place], [address] Phone: [phone]   Fax:  [phone]  Physical Therapy Evaluation  Patient Details  Name: [name] MRN: [id number] Date of Birth: 05-17-1970 Referring Provider: Dr. Jonathon Jordan   Encounter Date: 09/28/2017  PT End of Session - 09/28/17 0847    Visit Number  1    Date for PT Re-Evaluation  11/23/17    Authorization Type  tricare    PT Start Time  0750    PT Stop Time  0832    PT Time Calculation (min)  42 min    Activity Tolerance  Patient tolerated treatment well       Past Medical History:  Diagnosis Date  . Preeclampsia     History reviewed. No pertinent surgical history.  There were no vitals filed for this visit.   Subjective Assessment - 09/28/17 0755    Subjective  Reports general stiffness  for years;  About  6 months ago, while working her  leg twisted awkwardly.  Groin pain.  I can feel it walking.  Some discomort with plant and twist.  Had cortisone shot 2 months ago helped initially but then it came back.      Pertinent History  HTN    Limitations  Walking;House hold activities    How long can you sit comfortably?  as long as I want but slow process upon rising     How long can you walk comfortably?  as long as I want 7-8 miles a day    Diagnostic tests  none    Patient Stated Goals  feel better; be more mobile    Currently in Pain?  Yes    Pain Score  4     Pain Location  Hip    Pain Orientation  Right    Pain Type  Chronic pain    Pain Onset  More than a month ago    Pain Frequency  Intermittent    Aggravating Factors   walking; certain movements ; sleeping on right side;  sometimes putting on socks and shoes    Pain Relieving Factors  nothing specific         [facility] PT Assessment - 09/28/17 0001      Assessment   Medical Diagnosis  right hip pain     Referring Provider  Dr. Jonathon Jordan    Onset Date/Surgical Date  -- 6  months    Next MD Visit  next year    Prior Therapy  plantar fascitis both      Precautions   Precautions  None      Restrictions   Weight Bearing Restrictions  No      Balance Screen   Has the patient fallen in the past 6 months  Yes    How many times?  1 fell on right side    Has the patient had a decrease in activity level because of a fear of falling?   No    Is the patient reluctant to leave their home because of a fear of falling?   No      Home Social worker  Private residence    Living Arrangements  Spouse/significant other    Available Help at Discharge  Family    Type of [place] to enter    Home Layout  Two level    Alternate Level Stairs-Number  of Steps  10      Prior Function   Vocation  Full time employment    Biomedical engineer    Leisure  meet new people; travel [place] in July      Observation/Other Assessments   Focus on Therapeutic Outcomes (FOTO)   42% limitation       Posture/Postural Control   Posture Comments  pelvic drop on right with single leg standing       AROM   Right Hip Extension  -- lacks 5 degrees from neutral     Right Hip Flexion  100    Right Hip External Rotation   25    Right Hip Internal Rotation   10    Left Hip Extension  100    Left Hip External Rotation   45    Left Hip Internal Rotation   20      Strength   Right Hip Flexion  5/5    Right Hip Extension  4+/5    Right Hip External Rotation   4/5    Right Hip Internal Rotation  4/5    Right Hip ABduction  4-/5    Left Hip Flexion  5/5    Left Hip Extension  4+/5    Left Hip External Rotation  5/5    Left Hip Internal Rotation  5/5    Left Hip ABduction  4+/5      Flexibility   Soft Tissue Assessment /Muscle Length  yes    Hamstrings  75 degrees bil    Quadriceps  decreased right hip flexor length +10 degrees      Palpation   Palpation comment  tender right greater trochanteric bursa      Saralyn Pilar  (FABER) Test   Findings  Positive    Side  Right      Thomas Test    Findings  Positive    Side  Right      SI Distraction   Comments  no change      Hip Scouring   Comments  painful at 10:00 and 2:00 positions                Objective measurements completed on examination: See above findings.              PT Education - 09/28/17 0836    Education Details  HS, supine hip flexor stretch, standing and supine ITB stretch     Person(s) Educated  Patient    Methods  Explanation;Demonstration;Handout    Comprehension  Verbalized understanding;Returned demonstration;Verbal cues required       PT Short Term Goals - 09/28/17 0901      PT SHORT TERM GOAL #1   Title  The patient will demonstrate knowledge of initial HEP for ROM and strengthening    Time  4    Period  Weeks    Status  New    Target Date  10/26/17      PT SHORT TERM GOAL #2   Title  The patient will report a 30% improvement in pain with walking and putting on socks/shoes    Time  4    Period  Weeks    Status  New      PT SHORT TERM GOAL #3   Title  The patient will have improved hip external rotation to 30 degrees and internal rotation to 15 degrees for greater ease putting on socks/shoes    Time  4  Period  Weeks    Status  New        PT Long Term Goals - 09/28/17 0907      PT LONG TERM GOAL #1   Title  The patient will be independent in safe self progression of HEP for further improvements in ROM, strength and pain reduction    Time  8    Period  Weeks    Status  New    Target Date  11/23/17      PT LONG TERM GOAL #2   Title  The patient will report a 60%  improvement in right hip pain with walking, work tasks and putting on socks and shoes    Time  8    Period  Weeks    Status  New      PT LONG TERM GOAL #3   Title  The patient will have right hip strength of at least 4 to 4+/5 needed for standing and walking longer periods of time    Time  8    Period  Weeks    Status   New      PT LONG TERM GOAL #4   Title  FOTO functional outcome score improved from 42% limitation to 30% indicating improved function with less pain     Time  8    Period  Weeks    Status  New             Plan - 09/28/17 0846    Clinical Impression Statement  The patient reports a 6 month history right anterior and lateral hip pain after she twisted her leg awkardly.  She reports continued discomfort with walking, twisting, lying on right side and sometimes putting on socks/shoes.  Tenderness over right greater trochanteric bursa.  Decreased right hip flexor length.  Decreased hip internal and external rotation ROM with pain at 10:00 and 2:00 positions.  Pelvic drop with right single leg standing consistent with glute medius weakness.  She would benefit from PT to address these deficits.      History and Personal Factors relevant to plan of care:  minimal co-morbidities; good home support    Clinical Presentation  Stable    Clinical Decision Making  Low    Rehab Potential  Good    Clinical Impairments Affecting Rehab Potential  none;  history of bil plantar fascitis    PT Frequency  2x / week    PT Duration  8 weeks    PT Treatment/Interventions  ADLs/Self Care Home Management;Iontophoresis 4mg /ml Dexamethasone;Cryotherapy;Electrical Stimulation;Ultrasound;Moist Heat;Therapeutic activities;Therapeutic exercise;Patient/family education;Neuromuscular re-education;Dry needling;Manual techniques;Taping    PT Next Visit Plan  review HS with strap, supine hip flexor stretch, standing ITB initial HEP;  ionto if cert signed; start gluteus medius ex strengthening;  abdominal bracing; right  hip mobilizations    PT Home Exercise Plan  [email or web address]     Consulted and Agree with Plan of Care  Patient        By signing I understand that I am ordering/authorizing the use of Iontophoresis using 4 mg/mL of dexamethasone as a component of this plan of care.  Patient will benefit from skilled therapeutic  intervention in order to improve the following deficits and impairments:  Pain, Decreased strength, Impaired flexibility, Decreased range of motion  Visit Diagnosis: Pain in right hip - Plan: PT plan of care cert/re-cert  Stiffness of right hip, not elsewhere classified - Plan: PT plan of care cert/re-cert  Muscle weakness (generalized) - Plan: PT  plan of care cert/re-cert     Problem List Patient Active Problem List   Diagnosis Date Noted  . HYPERTENSION 09/01/2009  . CHEST PAIN 09/01/2009  Ruben Im, PT 09/28/17 9:12 AM Phone: [phone] Fax: [phone]  [name] 09/28/2017, 9:12 AM  [facility] [address], [place], [place], [address] Phone: [phone]   Fax:  [phone]  Name: [name] MRN: [id number] Date of Birth: 1970-04-27

## 2017-09-28 NOTE — Patient Instructions (Signed)
    Access Code: [email or web address]  URL: [email or web address]  Date: 09/28/2017  Prepared by: Ruben Im   Exercises  Hooklying Hamstring Stretch with Strap - 10 reps - 3 sets - 1x daily - 7x weekly  Supine ITB Stretch with Strap - 10 reps - 3 sets - 1x daily - 7x weekly  Standing ITB Stretch - 10 reps - 3 sets - 1x daily - 7x weekly  Standing ITB Stretch - 10 reps - 3 sets - 1x daily - 7x weekly  Hip Flexor Stretch at [facility] of [facility] - 10 reps - 3 sets - 1x daily - 7x weekly      Ruben Im PT [facility] [address] [place], [place] [address] Phone # [phone] Fax [phone]

## 2017-10-05 ENCOUNTER — Ambulatory Visit: Admitting: Physical Therapy

## 2017-10-05 ENCOUNTER — Encounter: Payer: Self-pay | Admitting: Physical Therapy

## 2017-10-05 DIAGNOSIS — M25551 Pain in right hip: Secondary | ICD-10-CM

## 2017-10-05 DIAGNOSIS — M25651 Stiffness of right hip, not elsewhere classified: Secondary | ICD-10-CM

## 2017-10-05 DIAGNOSIS — M6281 Muscle weakness (generalized): Secondary | ICD-10-CM

## 2017-10-05 NOTE — Patient Instructions (Signed)
  Abduction: Clam (Eccentric) - Side-Lying   Lie on side with knees bent. Tip forward a little.  Make your leg grow longer first then lift top knee, keeping feet together. Keep trunk steady. Slowly lower for 3-5 seconds. _15__ reps per set, ___ sets per day, ___ days per week. Add green band when you achieve _25__ repetitions.  Copyright  VHI. All rights reserved.     Trigger Point Dry Needling  . What is Trigger Point Dry Needling (DN)? o DN is a physical therapy technique used to treat muscle pain and dysfunction. Specifically, DN helps deactivate muscle trigger points (muscle knots).  o A thin filiform needle is used to penetrate the skin and stimulate the underlying trigger point. The goal is for a local twitch response (LTR) to occur and for the trigger point to relax. No medication of any kind is injected during the procedure.   . What Does Trigger Point Dry Needling Feel Like?  o The procedure feels different for each individual patient. Some patients report that they do not actually feel the needle enter the skin and overall the process is not painful. Very mild bleeding may occur. However, many patients feel a deep cramping in the muscle in which the needle was inserted. This is the local twitch response.   Marland Kitchen How Will I feel after the treatment? o Soreness is normal, and the onset of soreness may not occur for a few hours. Typically this soreness does not last longer than two days.  o Bruising is uncommon, however; ice can be used to decrease any possible bruising.  o In rare cases feeling tired or nauseous after the treatment is normal. In addition, your symptoms may get worse before they get better, this period will typically not last longer than 24 hours.   . What Can I do After My Treatment? o Increase your hydration by drinking more water for the next 24 hours. o You may place ice or heat on the areas treated that have become sore, however, do not use heat on inflamed or  bruised areas. Heat often brings more relief post needling. o You can continue your regular activities, but vigorous activity is not recommended initially after the treatment for 24 hours. o DN is best combined with other physical therapy such as strengthening, stretching, and other therapies.     Ruben Im PT [facility] [address], [place], [place] [address] Phone # [phone] Fax [phone]

## 2017-10-05 NOTE — Therapy (Signed)
[facility] [address], [place], [place], [address] Phone: [phone]   Fax:  [phone]  Physical Therapy Treatment  Patient Details  Name: Aubrea Meixner MRN: [id number] Date of Birth: 05-09-1970 Referring Provider: Dr. Jonathon Jordan   Encounter Date: 10/05/2017  PT End of Session - 10/05/17 0823    Visit Number  2    Date for PT Re-Evaluation  11/23/17    Authorization Type  tricare    PT Start Time  0731 30 min appt    PT Stop Time  0801    PT Time Calculation (min)  30 min    Activity Tolerance  Patient tolerated treatment well       Past Medical History:  Diagnosis Date  . Preeclampsia     History reviewed. No pertinent surgical history.  There were no vitals filed for this visit.  Subjective Assessment - 10/05/17 0732    Subjective  I feel great (about the same)   but I must conferss I haven't been faithful with exercises.  Swamped with work.  Pain is anterior/lateral right hip.  States she takes short steps.      Currently in Pain?  Yes    Pain Score  4     Pain Location  Hip    Pain Orientation  Right;Anterior    Pain Type  Chronic pain    Aggravating Factors   flexing hip with getting in the car /out of the car                       [facility] Adult PT Treatment/Exercise - 10/05/17 0001      Knee/Hip Exercises: Stretches   Active Hamstring Stretch  Left;3 reps;20 seconds right leg straight for hip flexor stretch    Hip Flexor Stretch  Right;3 reps;20 seconds    Hip Flexor Stretch Limitations  over side of the bed    ITB Stretch  Right;3 reps;20 seconds    ITB Stretch Limitations  sidelying      Knee/Hip Exercises: Sidelying   Clams  15x plus 15x with green band      Knee/Hip Exercises: Prone   Hamstring Curl  15 reps    Hamstring Curl Limitations  bil      Iontophoresis   Type of Iontophoresis  Dexamethasone    Location  right anterior/lateral hip    Dose  4 mg/ml     Time  4 hour patch      Manual Therapy   Joint Mobilization  right hip long axis distraction, inferior mob grade 3 3x 20 sec; prone PA, PA in internal rotation and PA in external rotation grade 3 3x 20 sec             PT Education - 10/05/17 0823    Education Details  clam; ionto info    Person(s) Educated  Patient    Methods  Explanation;Demonstration;Handout    Comprehension  Returned demonstration;Verbalized understanding       PT Short Term Goals - 09/28/17 0901      PT SHORT TERM GOAL #1   Title  The patient will demonstrate knowledge of initial HEP for ROM and strengthening    Time  4    Period  Weeks    Status  New    Target Date  10/26/17      PT SHORT TERM GOAL #2   Title  The patient will report a 30% improvement in  pain with walking and putting on socks/shoes    Time  4    Period  Weeks    Status  New      PT SHORT TERM GOAL #3   Title  The patient will have improved hip external rotation to 30 degrees and internal rotation to 15 degrees for greater ease putting on socks/shoes    Time  4    Period  Weeks    Status  New        PT Long Term Goals - 09/28/17 0907      PT LONG TERM GOAL #1   Title  The patient will be independent in safe self progression of HEP for further improvements in ROM, strength and pain reduction    Time  8    Period  Weeks    Status  New    Target Date  11/23/17      PT LONG TERM GOAL #2   Title  The patient will report a 60%  improvement in right hip pain with walking, work tasks and putting on socks and shoes    Time  8    Period  Weeks    Status  New      PT LONG TERM GOAL #3   Title  The patient will have right hip strength of at least 4 to 4+/5 needed for standing and walking longer periods of time    Time  8    Period  Weeks    Status  New      PT LONG TERM GOAL #4   Title  FOTO functional outcome score improved from 42% limitation to 30% indicating improved function with less pain     Time  8    Period  Weeks     Status  New            Plan - 10/05/17 3329    Clinical Impression Statement  The patient has discomfort with hip flexion on right.  She responds well to hip flexor and ITB  stretching as well as hip mobilizations particularly posterior to anterior direction mobs.  She reports she feels much looser post treatment session and demonstrates much improved stride length.      Rehab Potential  Good    Clinical Impairments Affecting Rehab Potential  none;  history of bil plantar fascitis    PT Frequency  2x / week    PT Duration  8 weeks    PT Treatment/Interventions  ADLs/Self Care Home Management;Iontophoresis 4mg /ml Dexamethasone;Cryotherapy;Electrical Stimulation;Ultrasound;Moist Heat;Therapeutic activities;Therapeutic exercise;Patient/family education;Neuromuscular re-education;Dry needling;Manual techniques;Taping    PT Next Visit Plan  ionto #2; right hip flexor stretching;  gluteus medius strengthening;  core strengthening;  hip manual therapy       Patient will benefit from skilled therapeutic intervention in order to improve the following deficits and impairments:  Pain, Decreased strength, Impaired flexibility, Decreased range of motion  Visit Diagnosis: Pain in right hip  Stiffness of right hip, not elsewhere classified  Muscle weakness (generalized)     Problem List Patient Active Problem List   Diagnosis Date Noted  . HYPERTENSION 09/01/2009  . CHEST PAIN 09/01/2009   Ruben Im, PT 10/05/17 8:30 AM Phone: [phone] Fax: [phone]  [name] 10/05/2017, 8:30 AM  [facility] [address] [place], [place], [address] Phone: [phone]   Fax:  [phone]  Name: Tamaka Sawin MRN: [id number] Date of Birth: 1970-12-04

## 2017-10-06 ENCOUNTER — Ambulatory Visit: Admitting: Physical Therapy

## 2017-10-06 ENCOUNTER — Encounter: Payer: Self-pay | Admitting: Physical Therapy

## 2017-10-06 DIAGNOSIS — M25551 Pain in right hip: Secondary | ICD-10-CM | POA: Diagnosis not present

## 2017-10-06 DIAGNOSIS — M25651 Stiffness of right hip, not elsewhere classified: Secondary | ICD-10-CM

## 2017-10-06 DIAGNOSIS — M6281 Muscle weakness (generalized): Secondary | ICD-10-CM

## 2017-10-06 NOTE — Therapy (Signed)
[facility] [address], [place], [place], [address] Phone: [phone]   Fax:  [phone]  Physical Therapy Treatment  Patient Details  Name: [name] MRN: [id number] Date of Birth: 1970-06-13 Referring Provider: Dr. Jonathon Jordan   Encounter Date: 10/06/2017  PT End of Session - 10/06/17 0829    Visit Number  3    Date for PT Re-Evaluation  11/23/17    Authorization Type  tricare    PT Start Time  0803    PT Stop Time  0843    PT Time Calculation (min)  40 min    Activity Tolerance  Patient tolerated treatment well;No increased pain    Behavior During Therapy  WFL for tasks assessed/performed       Past Medical History:  Diagnosis Date  . Preeclampsia     History reviewed. No pertinent surgical history.  There were no vitals filed for this visit.  Subjective Assessment - 10/06/17 0804    Subjective  Pt reports that she has minimal pain today. She felt great after her last session.     Currently in Pain?  Yes    Pain Score  2     Pain Location  Hip    Pain Orientation  Right    Pain Descriptors / Indicators  Aching;Dull    Pain Type  Chronic pain    Pain Onset  More than a month ago    Pain Frequency  Intermittent    Aggravating Factors   flexing hip with getting in the car    Pain Relieving Factors  stretches help                        [facility] Adult PT Treatment/Exercise - 10/06/17 0001      Knee/Hip Exercises: Stretches   Piriformis Stretch  Both;3 reps;30 seconds      Knee/Hip Exercises: Standing   Other Standing Knee Exercises  Rt hip abduction/adduction slide x10 reps       Knee/Hip Exercises: Supine   Bridges  Both;2 sets;10 reps      Knee/Hip Exercises: Sidelying   Hip ABduction  2 sets;5 reps;Right;Left      Knee/Hip Exercises: Prone   Hip Extension  2 sets;10 reps;Right      Iontophoresis   Type of Iontophoresis  Dexamethasone    Location  right  anterior/lateral hip    Dose  4 mg/ml     Time  4 hour patch      Manual Therapy   Manual therapy comments  Rt quadriceps stretch 3x30 sec, contract relax hold x2 bouts     Joint Mobilization  Rt hip grade 4 PA mobilizations x3 bouts; Lt hip long axis distraction grade 4 x3 bouts             PT Education - 10/06/17 0842    Education Details  HEP clarifications     Person(s) Educated  Patient    Methods  Explanation    Comprehension  Verbalized understanding       PT Short Term Goals - 10/06/17 0845      PT SHORT TERM GOAL #1   Title  The patient will demonstrate knowledge of initial HEP for ROM and strengthening    Time  4    Period  Weeks    Status  Achieved      PT SHORT TERM GOAL #2   Title  The patient will report a  30% improvement in pain with walking and putting on socks/shoes    Time  4    Period  Weeks    Status  On-going      PT SHORT TERM GOAL #3   Title  The patient will have improved hip external rotation to 30 degrees and internal rotation to 15 degrees for greater ease putting on socks/shoes    Time  4    Period  Weeks    Status  New        PT Long Term Goals - 09/28/17 0907      PT LONG TERM GOAL #1   Title  The patient will be independent in safe self progression of HEP for further improvements in ROM, strength and pain reduction    Time  8    Period  Weeks    Status  New    Target Date  11/23/17      PT LONG TERM GOAL #2   Title  The patient will report a 60%  improvement in right hip pain with walking, work tasks and putting on socks and shoes    Time  8    Period  Weeks    Status  New      PT LONG TERM GOAL #3   Title  The patient will have right hip strength of at least 4 to 4+/5 needed for standing and walking longer periods of time    Time  8    Period  Weeks    Status  New      PT LONG TERM GOAL #4   Title  FOTO functional outcome score improved from 42% limitation to 30% indicating improved function with less pain     Time   8    Period  Weeks    Status  New            Plan - 10/06/17 7989    Clinical Impression Statement  Pt arrived reporting improvements in pain following her last session. Continued today with therex and joint mobilizations to further decrease pain and improve hip strength. Noted compensations with sidelying hip abduction which was improved with verbal and tactile cues from the therapist. Ended session without pain and pt verbalized understanding of HEP discussion.     Rehab Potential  Good    Clinical Impairments Affecting Rehab Potential  none;  history of bil plantar fascitis    PT Frequency  2x / week    PT Duration  8 weeks    PT Treatment/Interventions  ADLs/Self Care Home Management;Iontophoresis 4mg /ml Dexamethasone;Cryotherapy;Electrical Stimulation;Ultrasound;Moist Heat;Therapeutic activities;Therapeutic exercise;Patient/family education;Neuromuscular re-education;Dry needling;Manual techniques;Taping    PT Next Visit Plan  ionto #3; right hip flexor stretching;  gluteus medius/max strengthening;  core strengthening;  hip manual therapy    PT Home Exercise Plan  [id number]     Consulted and Agree with Plan of Care  Patient       Patient will benefit from skilled therapeutic intervention in order to improve the following deficits and impairments:  Pain, Decreased strength, Impaired flexibility, Decreased range of motion  Visit Diagnosis: Pain in right hip  Stiffness of right hip, not elsewhere classified  Muscle weakness (generalized)     Problem List Patient Active Problem List   Diagnosis Date Noted  . HYPERTENSION 09/01/2009  . CHEST PAIN 09/01/2009    8:48 AM,10/06/17 Sherol Dade PT, DPT [facility] [place]  [facility] [address]  [place], [place], [place], [address] Phone: [phone]   Fax:  [phone]  Name: [name] MRN: [id number] Date of  Birth: August 21, 1970

## 2017-10-10 ENCOUNTER — Encounter: Payer: Self-pay | Admitting: Physical Therapy

## 2017-10-10 ENCOUNTER — Ambulatory Visit: Admitting: Physical Therapy

## 2017-10-10 DIAGNOSIS — M25551 Pain in right hip: Secondary | ICD-10-CM

## 2017-10-10 DIAGNOSIS — M25651 Stiffness of right hip, not elsewhere classified: Secondary | ICD-10-CM

## 2017-10-10 DIAGNOSIS — M6281 Muscle weakness (generalized): Secondary | ICD-10-CM

## 2017-10-10 NOTE — Therapy (Signed)
[facility] [address] [place], [place], [address] Phone: [phone]   Fax:  [phone]  Physical Therapy Treatment  Patient Details  Name: [name] MRN: [id number] Date of Birth: 22-Jul-1970 Referring Provider: Dr. Jonathon Jordan   Encounter Date: 10/10/2017  PT End of Session - 10/10/17 1316    Visit Number  4    Date for PT Re-Evaluation  11/23/17    Authorization Type  tricare    PT Start Time  0728    PT Stop Time  0806    PT Time Calculation (min)  38 min    Activity Tolerance  Patient tolerated treatment well       Past Medical History:  Diagnosis Date  . Preeclampsia     History reviewed. No pertinent surgical history.  There were no vitals filed for this visit.  Subjective Assessment - 10/10/17 0729    Subjective  I did good last time.  Very stiff this morning.  Walked on the treadmill 50 minutes.      Currently in Pain?  Yes    Pain Score  2     Pain Location  Hip    Pain Orientation  Right    Pain Type  Chronic pain                       [facility] Adult PT Treatment/Exercise - 10/10/17 0001      Knee/Hip Exercises: Stretches   Piriformis Stretch  Both;3 reps;30 seconds with green ball    Other Knee/Hip Stretches  lumbar rotation with green ball 5x    Other Knee/Hip Stretches  frog leg stretch with green ball 5x      Knee/Hip Exercises: Standing   Hip Abduction  Stengthening;Right;Left;10 reps    Abduction Limitations  red band    Hip Extension  Stengthening;Right;Left;10 reps    Extension Limitations  red band     Other Standing Knee Exercises  SLS with green band diagonals 10x each side      Knee/Hip Exercises: Supine   Bridges with Clamshell  Strengthening;Both;10 reps green band    Other Supine Knee/Hip Exercises  bridge with LEs on ball 10x      Iontophoresis   Type of Iontophoresis  Dexamethasone    Location  right anterior/lateral hip    Dose  4 mg/ml      Time  4 hour patch      Manual Therapy   Joint Mobilization  right hip long axis distraction, inferior mob grade 3 3x 20 sec; prone PA, PA in internal rotation and PA in external rotation grade 3 3x 20 sec             PT Education - 10/10/17 1315    Education Details  red band hip extension and hip abduction standing    Person(s) Educated  Patient    Methods  Explanation;Handout    Comprehension  Verbalized understanding;Returned demonstration       PT Short Term Goals - 10/10/17 1322      PT SHORT TERM GOAL #1   Title  The patient will demonstrate knowledge of initial HEP for ROM and strengthening    Status  Achieved      PT SHORT TERM GOAL #2   Title  The patient will report a 30% improvement in pain with walking and putting on socks/shoes    Time  4    Period  Weeks  Status  On-going      PT SHORT TERM GOAL #3   Title  The patient will have improved hip external rotation to 30 degrees and internal rotation to 15 degrees for greater ease putting on socks/shoes    Time  4    Period  Weeks    Status  On-going        PT Long Term Goals - 09/28/17 0907      PT LONG TERM GOAL #1   Title  The patient will be independent in safe self progression of HEP for further improvements in ROM, strength and pain reduction    Time  8    Period  Weeks    Status  New    Target Date  11/23/17      PT LONG TERM GOAL #2   Title  The patient will report a 60%  improvement in right hip pain with walking, work tasks and putting on socks and shoes    Time  8    Period  Weeks    Status  New      PT LONG TERM GOAL #3   Title  The patient will have right hip strength of at least 4 to 4+/5 needed for standing and walking longer periods of time    Time  8    Period  Weeks    Status  New      PT LONG TERM GOAL #4   Title  FOTO functional outcome score improved from 42% limitation to 30% indicating improved function with less pain     Time  8    Period  Weeks    Status  New             Plan - 10/10/17 1318    Clinical Impression Statement  The patient reports good relief with self stretching and manual therapy.  Improving joint capsule mobility.  Muscular fatigue in gluteals bilaterally with single limb standing but not painful.  Good response to ionto.      Rehab Potential  Good    Clinical Impairments Affecting Rehab Potential  none;  history of bil plantar fascitis    PT Frequency  2x / week    PT Duration  8 weeks    PT Treatment/Interventions  ADLs/Self Care Home Management;Iontophoresis 4mg /ml Dexamethasone;Cryotherapy;Electrical Stimulation;Ultrasound;Moist Heat;Therapeutic activities;Therapeutic exercise;Patient/family education;Neuromuscular re-education;Dry needling;Manual techniques;Taping    PT Next Visit Plan  ionto #4; right hip flexor stretching;  gluteus medius/max strengthening;  core strengthening;  hip manual therapy; recheck hip internal and external rotation ROM       Patient will benefit from skilled therapeutic intervention in order to improve the following deficits and impairments:  Pain, Decreased strength, Impaired flexibility, Decreased range of motion  Visit Diagnosis: Pain in right hip  Stiffness of right hip, not elsewhere classified  Muscle weakness (generalized)     Problem List Patient Active Problem List   Diagnosis Date Noted  . HYPERTENSION 09/01/2009  . CHEST PAIN 09/01/2009   Ruben Im, PT 10/10/17 1:23 PM Phone: [phone] Fax: [phone]  [name] 10/10/2017, 1:23 PM  [facility] [address] [place], [place], [address] Phone: [phone]   Fax:  [phone]  Name: Charnelle Bergeman MRN: [id number] Date of Birth: 1970-07-07

## 2017-10-10 NOTE — Patient Instructions (Signed)
  Access Code: [email or web address]  URL: [email or web address]  Date: 10/10/2017  Prepared by: Ruben Im   Exercises  Hooklying Hamstring Stretch with Strap - 10 reps - 3 sets - 1x daily - 7x weekly  Supine ITB Stretch with Strap - 10 reps - 3 sets - 1x daily - 7x weekly  Standing ITB Stretch - 10 reps - 3 sets - 1x daily - 7x weekly  Standing ITB Stretch - 10 reps - 3 sets - 1x daily - 7x weekly  Hip Flexor Stretch at Edge of Bed - 10 reps - 3 sets - 1x daily - 7x weekly  Standing Hip Extension with Anchored Resistance - 10 reps - 1 sets - 1x daily - 7x weekly  Standing Repeated Hip Abduction with Resistance - 10 reps - 1 sets - 1x daily - 7x weekly       [facility] [address] [place], [place] [address] Phone # [phone] Fax [phone]

## 2017-10-12 ENCOUNTER — Ambulatory Visit: Admitting: Physical Therapy

## 2017-10-12 ENCOUNTER — Encounter: Payer: Self-pay | Admitting: Physical Therapy

## 2017-10-12 DIAGNOSIS — M25551 Pain in right hip: Secondary | ICD-10-CM | POA: Diagnosis not present

## 2017-10-12 DIAGNOSIS — M25651 Stiffness of right hip, not elsewhere classified: Secondary | ICD-10-CM

## 2017-10-12 DIAGNOSIS — M6281 Muscle weakness (generalized): Secondary | ICD-10-CM

## 2017-10-12 NOTE — Therapy (Signed)
[facility] [address], [place], [place], [address] Phone: [phone]   Fax:  [phone]  Physical Therapy Treatment  Patient Details  Name: [name] MRN: [id number] Date of Birth: 1970/08/19 Referring Provider: Dr. Jonathon Jordan   Encounter Date: 10/12/2017  PT End of Session - 10/12/17 1929    Visit Number  5    Date for PT Re-Evaluation  11/23/17    Authorization Type  tricare    PT Start Time  0801    PT Stop Time  0845    PT Time Calculation (min)  44 min    Activity Tolerance  Patient tolerated treatment well       Past Medical History:  Diagnosis Date  . Preeclampsia     History reviewed. No pertinent surgical history.  There were no vitals filed for this visit.  Subjective Assessment - 10/12/17 0804    Subjective  Just stiff but no pain.  Always stiff in the mornings.   I did 8 miles walking yesterday.      Currently in Pain?  No/denies    Pain Score  0-No pain    Pain Orientation  Right    Pain Type  Chronic pain                       [facility] Adult PT Treatment/Exercise - 10/12/17 0001      Knee/Hip Exercises: Stretches   Other Knee/Hip Stretches  2nd step hip flexor stretches with UE movements 5x       Knee/Hip Exercises: Standing   Hip Flexion  Stengthening;Right;Left;10 reps red band    Hip ADduction  Strengthening;Right;Left;10 reps red band    Hip Abduction  Stengthening;Right;Left;10 reps    Abduction Limitations  red band    Hip Extension  Stengthening;Right;Left;10 reps    Extension Limitations  red band     Other Standing Knee Exercises  high stepping, sidestepping, hand to opposite knee, butt kicks 2 laps each    Other Standing Knee Exercises  green band around thighs with sidestepping, monster walks, tight rope walk 2 laps each      Knee/Hip Exercises: Prone   Other Prone Exercises  1/2 kneeling with UE reaching, rocking 5x each side  needs 1 UE support on  table for balance      Iontophoresis   Type of Iontophoresis  Dexamethasone    Location  right anterior/lateral hip    Dose  4 mg/ml     Time  4 hour patch      Manual Therapy   Joint Mobilization  right hip long axis distraction, inferior mob grade 3 3x 20 sec; prone PA, PA in internal rotation and PA in external rotation grade 3 3x 20 sec             PT Education - 10/12/17 1928    Education Details  1/2 kneeling ex    Person(s) Educated  Patient    Methods  Explanation;Demonstration;Handout    Comprehension  Returned demonstration;Verbalized understanding       PT Short Term Goals - 10/10/17 1322      PT SHORT TERM GOAL #1   Title  The patient will demonstrate knowledge of initial HEP for ROM and strengthening    Status  Achieved      PT SHORT TERM GOAL #2   Title  The patient will report a 30% improvement in pain with walking and putting on socks/shoes  Time  4    Period  Weeks    Status  On-going      PT SHORT TERM GOAL #3   Title  The patient will have improved hip external rotation to 30 degrees and internal rotation to 15 degrees for greater ease putting on socks/shoes    Time  4    Period  Weeks    Status  On-going        PT Long Term Goals - 09/28/17 0907      PT LONG TERM GOAL #1   Title  The patient will be independent in safe self progression of HEP for further improvements in ROM, strength and pain reduction    Time  8    Period  Weeks    Status  New    Target Date  11/23/17      PT LONG TERM GOAL #2   Title  The patient will report a 60%  improvement in right hip pain with walking, work tasks and putting on socks and shoes    Time  8    Period  Weeks    Status  New      PT LONG TERM GOAL #3   Title  The patient will have right hip strength of at least 4 to 4+/5 needed for standing and walking longer periods of time    Time  8    Period  Weeks    Status  New      PT LONG TERM GOAL #4   Title  FOTO functional outcome score improved  from 42% limitation to 30% indicating improved function with less pain     Time  8    Period  Weeks    Status  New            Plan - 10/12/17 1929    Clinical Impression Statement  The patient is progressing well hip mobility.  Her primary complaint is morning stiffness rather than pain.  Difficulty stabilizing in the 1/2 kneel position consistent with hip and core weakness.  On track to meet rehab goals.      Rehab Potential  Good    Clinical Impairments Affecting Rehab Potential  none;  history of bil plantar fascitis    PT Frequency  2x / week    PT Duration  8 weeks    PT Treatment/Interventions  ADLs/Self Care Home Management;Iontophoresis 4mg /ml Dexamethasone;Cryotherapy;Electrical Stimulation;Ultrasound;Moist Heat;Therapeutic activities;Therapeutic exercise;Patient/family education;Neuromuscular re-education;Dry needling;Manual techniques;Taping    PT Next Visit Plan  ionto #5; right hip flexor stretching;  gluteus medius/max strengthening;  core strengthening;  hip manual therapy; recheck hip internal and external rotation ROM    PT Home Exercise Plan  [id number]        Patient will benefit from skilled therapeutic intervention in order to improve the following deficits and impairments:  Pain, Decreased strength, Impaired flexibility, Decreased range of motion  Visit Diagnosis: Pain in right hip  Stiffness of right hip, not elsewhere classified  Muscle weakness (generalized)     Problem List Patient Active Problem List   Diagnosis Date Noted  . HYPERTENSION 09/01/2009  . CHEST PAIN 09/01/2009   Ruben Im, PT 10/12/17 7:33 PM Phone: [phone] Fax: [phone]  [name] 10/12/2017, 7:33 PM  [facility] [address], [place], [place], [address] Phone: [phone]   Fax:  [phone]  Name: [name] MRN: [id number] Date of Birth: Dec 14, 1970

## 2017-10-12 NOTE — Patient Instructions (Signed)
[name] PT [facility] [address], [address] [place], [place] [address] Phone # [phone] Fax [phone]    

## 2017-10-17 ENCOUNTER — Encounter: Payer: Self-pay | Admitting: Physical Therapy

## 2017-10-17 ENCOUNTER — Ambulatory Visit: Admitting: Physical Therapy

## 2017-10-17 DIAGNOSIS — M25651 Stiffness of right hip, not elsewhere classified: Secondary | ICD-10-CM

## 2017-10-17 DIAGNOSIS — M6281 Muscle weakness (generalized): Secondary | ICD-10-CM

## 2017-10-17 DIAGNOSIS — M25551 Pain in right hip: Secondary | ICD-10-CM | POA: Diagnosis not present

## 2017-10-17 NOTE — Therapy (Signed)
[facility] [address] [place], [place], [address] Phone: [phone]   Fax:  [phone]  Physical Therapy Treatment  Patient Details  Name: [name] MRN: [id number] Date of Birth: 05-Jun-1970 Referring Provider: Dr. Jonathon Jordan   Encounter Date: 10/17/2017  PT End of Session - 10/17/17 0904    Visit Number  6    Date for PT Re-Evaluation  11/23/17    Authorization Type  tricare    PT Start Time  0730    PT Stop Time  0801 30 min appt session     PT Time Calculation (min)  31 min    Activity Tolerance  Patient tolerated treatment well       Past Medical History:  Diagnosis Date  . Preeclampsia     History reviewed. No pertinent surgical history.  There were no vitals filed for this visit.  Subjective Assessment - 10/17/17 0735    Subjective  I've lost over 11 pounds.      Currently in Pain?  No/denies    Pain Score  0-No pain    Pain Type  Chronic pain                       [facility] Adult PT Treatment/Exercise - 10/17/17 0001      Knee/Hip Exercises: Stretches   Other Knee/Hip Stretches  psoas doorway stretch 3x5    Other Knee/Hip Stretches  foot in chair internal and external rotation 5x bil       Knee/Hip Exercises: Standing   Other Standing Knee Exercises  floor sliders 3x5 directions bil     Other Standing Knee Exercises  high step, high step internal and external rotation, mini lunges 2 laps each       Knee/Hip Exercises: Prone   Hip Extension  AROM;Strengthening;Right;Left    Other Prone Exercises  1/2 kneeling with UE reaching, rocking 5x each side  needs 1 UE support on table for balance    Other Prone Exercises  bent knee lifts 5x bil       Iontophoresis   Type of Iontophoresis  Dexamethasone    Location  right anterior/lateral hip    Dose  4 mg/ml     Time  4 hour patch      Manual Therapy   Joint Mobilization  right hip long axis distraction, inferior mob grade  3 3x 20 sec; prone PA, PA in internal rotation and PA in external rotation grade 3 3x 20 sec               PT Short Term Goals - 10/17/17 0908      PT SHORT TERM GOAL #1   Title  The patient will demonstrate knowledge of initial HEP for ROM and strengthening    Status  Achieved      PT SHORT TERM GOAL #2   Title  The patient will report a 30% improvement in pain with walking and putting on socks/shoes    Time  4    Period  Weeks    Status  On-going      PT SHORT TERM GOAL #3   Title  The patient will have improved hip external rotation to 30 degrees and internal rotation to 15 degrees for greater ease putting on socks/shoes    Time  4    Period  Weeks    Status  On-going        PT Long Term Goals -  09/28/17 0907      PT LONG TERM GOAL #1   Title  The patient will be independent in safe self progression of HEP for further improvements in ROM, strength and pain reduction    Time  8    Period  Weeks    Status  New    Target Date  11/23/17      PT LONG TERM GOAL #2   Title  The patient will report a 60%  improvement in right hip pain with walking, work tasks and putting on socks and shoes    Time  8    Period  Weeks    Status  New      PT LONG TERM GOAL #3   Title  The patient will have right hip strength of at least 4 to 4+/5 needed for standing and walking longer periods of time    Time  8    Period  Weeks    Status  New      PT LONG TERM GOAL #4   Title  FOTO functional outcome score improved from 42% limitation to 30% indicating improved function with less pain     Time  8    Period  Weeks    Status  New            Plan - 10/17/17 2633    Clinical Impression Statement  The patient reports no hip pain recently, mostly morning stiffness.  She continues to have right hip weakness especially noted with single leg standing and 1/2 kneel positions.  Therapist closely monitoring for pain (none produced).  Improving hip joint and soft tissue mobility.       Rehab Potential  Good    Clinical Impairments Affecting Rehab Potential  none;  history of bil plantar fascitis    PT Frequency  2x / week    PT Duration  8 weeks    PT Treatment/Interventions  ADLs/Self Care Home Management;Iontophoresis 4mg /ml Dexamethasone;Cryotherapy;Electrical Stimulation;Ultrasound;Moist Heat;Therapeutic activities;Therapeutic exercise;Patient/family education;Neuromuscular re-education;Dry needling;Manual techniques;Taping    PT Next Visit Plan  check progress toward goals;  right hip flexor stretching;  gluteus medius/max strengthening;  core strengthening;  hip manual therapy; recheck hip internal and external rotation ROM    PT Home Exercise Plan  [id number]        Patient will benefit from skilled therapeutic intervention in order to improve the following deficits and impairments:  Pain, Decreased strength, Impaired flexibility, Decreased range of motion  Visit Diagnosis: Pain in right hip  Stiffness of right hip, not elsewhere classified  Muscle weakness (generalized)     Problem List Patient Active Problem List   Diagnosis Date Noted  . HYPERTENSION 09/01/2009  . CHEST PAIN 09/01/2009   Ruben Im, PT 10/17/17 9:09 AM Phone: [phone] Fax: [phone]  [name] 10/17/2017, 9:09 AM  [facility] [address], [place], [place], [address] Phone: [phone]   Fax:  [phone]  Name: [name] MRN: [id number] Date of Birth: 1971/01/06

## 2017-10-19 ENCOUNTER — Ambulatory Visit: Admitting: Physical Therapy

## 2017-10-19 ENCOUNTER — Encounter: Payer: Self-pay | Admitting: Physical Therapy

## 2017-10-19 DIAGNOSIS — M6281 Muscle weakness (generalized): Secondary | ICD-10-CM

## 2017-10-19 DIAGNOSIS — M25551 Pain in right hip: Secondary | ICD-10-CM

## 2017-10-19 DIAGNOSIS — M25651 Stiffness of right hip, not elsewhere classified: Secondary | ICD-10-CM

## 2017-10-19 NOTE — Therapy (Signed)
[facility] [address], [place], [place], [address] Phone: [phone]   Fax:  [phone]  Physical Therapy Treatment  Patient Details  Name: [name] MRN: [id number] Date of Birth: 17-Oct-1970 Referring Provider: Dr. Jonathon Jordan   Encounter Date: 10/19/2017  PT End of Session - 10/19/17 1356    Visit Number  7    Date for PT Re-Evaluation  11/23/17    Authorization Type  tricare    PT Start Time  0730    PT Stop Time  0800 30 min appt session    PT Time Calculation (min)  30 min    Activity Tolerance  Patient tolerated treatment well       Past Medical History:  Diagnosis Date  . Preeclampsia     History reviewed. No pertinent surgical history.  There were no vitals filed for this visit.  Subjective Assessment - 10/19/17 0733    Subjective  Not as tight this morning.   Going to walk later.   No longer feeling her hip pain getting in and out of the car.      Currently in Pain?  Yes    Pain Score  2     Pain Location  Hip    Pain Orientation  Right    Pain Type  Chronic pain         [facility] PT Assessment - 10/19/17 0001      AROM   Right Hip Flexion  112    Right Hip External Rotation   32    Right Hip Internal Rotation   20    Left Hip Extension  108    Left Hip External Rotation   45    Left Hip Internal Rotation   20                   [facility] Adult PT Treatment/Exercise - 10/19/17 0001      Knee/Hip Exercises: Stretches   Other Knee/Hip Stretches  rainbow stretches bil overhead 10x with foot on 2nd step       Knee/Hip Exercises: Standing   Hip Extension  -- blue band around thighs    Other Standing Knee Exercises  blue band clams 10x    Other Standing Knee Exercises  wall psoas march with red band around feet      Knee/Hip Exercises: Supine   Other Supine Knee/Hip Exercises  ball squeeze 10x       Knee/Hip Exercises: Sidelying   Hip ADduction  AROM;Right;Left;5 reps       Knee/Hip Exercises: Prone   Other Prone Exercises  quadruped crossed ankle rocks 10x    Other Prone Exercises  quadruped hip abducted out to the side rocking and thread the needle 10x      Manual Therapy   Joint Mobilization  right hip long axis distraction, inferior mob grade 3 3x 20 sec; prone PA, PA in internal rotation and PA in external rotation grade 3 3x 20 sec               PT Short Term Goals - 10/19/17 1531      PT SHORT TERM GOAL #1   Title  The patient will demonstrate knowledge of initial HEP for ROM and strengthening    Status  Achieved      PT SHORT TERM GOAL #2   Title  The patient will report a 30% improvement in pain with walking and putting on socks/shoes  Status  Achieved      PT SHORT TERM GOAL #3   Title  The patient will have improved hip external rotation to 30 degrees and internal rotation to 15 degrees for greater ease putting on socks/shoes    Status  Achieved        PT Long Term Goals - 09/28/17 0907      PT LONG TERM GOAL #1   Title  The patient will be independent in safe self progression of HEP for further improvements in ROM, strength and pain reduction    Time  8    Period  Weeks    Status  New    Target Date  11/23/17      PT LONG TERM GOAL #2   Title  The patient will report a 60%  improvement in right hip pain with walking, work tasks and putting on socks and shoes    Time  8    Period  Weeks    Status  New      PT LONG TERM GOAL #3   Title  The patient will have right hip strength of at least 4 to 4+/5 needed for standing and walking longer periods of time    Time  8    Period  Weeks    Status  New      PT LONG TERM GOAL #4   Title  FOTO functional outcome score improved from 42% limitation to 30% indicating improved function with less pain     Time  8    Period  Weeks    Status  New            Plan - 10/19/17 1527    Clinical Impression Statement  The patient has improved with hip ROM since initial  visit and is no longer painful with many functional activities like getting in/out of the car.  Intermittent stiffness persists especially in the mornings or with a lot of walking.  Therapist closely monitoring response with all interventions.   Progressing with rehab goals.  All STGs met.    Rehab Potential  Good    Clinical Impairments Affecting Rehab Potential  none;  history of bil plantar fascitis    PT Frequency  2x / week    PT Duration  8 weeks    PT Treatment/Interventions  ADLs/Self Care Home Management;Iontophoresis 52m/ml Dexamethasone;Cryotherapy;Electrical Stimulation;Ultrasound;Moist Heat;Therapeutic activities;Therapeutic exercise;Patient/family education;Neuromuscular re-education;Dry needling;Manual techniques;Taping    PT Next Visit Plan    right hip flexor stretching;  gluteus medius/max strengthening;  core strengthening;  hip manual therapy;  hip internal and external rotation ROM    PT Home Exercise Plan  [id number]        Patient will benefit from skilled therapeutic intervention in order to improve the following deficits and impairments:  Pain, Decreased strength, Impaired flexibility, Decreased range of motion  Visit Diagnosis: Pain in right hip  Stiffness of right hip, not elsewhere classified  Muscle weakness (generalized)     Problem List Patient Active Problem List   Diagnosis Date Noted  . HYPERTENSION 09/01/2009  . CHEST PAIN 09/01/2009   SRuben Im PT 10/19/17 3:32 PM Phone: [phone]: [phone] SAlvera Singh6/27/2019, 3:32 PM  [facility] [address] [place] [address]Phone: [phone]  Fax:  [phone] Name: Mindy CrookeMRN: [id number] of Birth: [birth year]

## 2017-10-24 ENCOUNTER — Encounter: Payer: Self-pay | Admitting: Physical Therapy

## 2017-10-24 ENCOUNTER — Ambulatory Visit: Attending: Family Medicine | Admitting: Physical Therapy

## 2017-10-24 DIAGNOSIS — M6281 Muscle weakness (generalized): Secondary | ICD-10-CM | POA: Insufficient documentation

## 2017-10-24 DIAGNOSIS — M25551 Pain in right hip: Secondary | ICD-10-CM | POA: Insufficient documentation

## 2017-10-24 DIAGNOSIS — M25651 Stiffness of right hip, not elsewhere classified: Secondary | ICD-10-CM

## 2017-10-24 NOTE — Therapy (Signed)
[facility] [address] [place], [place], [address] Phone: [phone]   Fax:  [phone]  Physical Therapy Treatment  Patient Details  Name: [name] MRN: [id number] Date of Birth: 04-03-1971 Referring Provider: Dr. Jonathon Jordan   Encounter Date: 10/24/2017  PT End of Session - 10/24/17 0852    Visit Number  8    Date for PT Re-Evaluation  11/23/17    Authorization Type  tricare    PT Start Time  0730    PT Stop Time  0800 30 min appt slot    PT Time Calculation (min)  30 min    Activity Tolerance  Patient tolerated treatment well       Past Medical History:  Diagnosis Date  . Preeclampsia     History reviewed. No pertinent surgical history.  There were no vitals filed for this visit.  Subjective Assessment - 10/24/17 0733    Subjective  My right knee has been bothering me from going up the stairs.  Walked 3 miles this morning.  No hip pain.  I was able to maneuver on/off the bed much easier.      Currently in Pain?  No/denies    Pain Score  0-No pain    Pain Location  Hip    Pain Orientation  Right    Pain Type  Chronic pain                       [facility] Adult PT Treatment/Exercise - 10/24/17 0001      Lumbar Exercises: Supine   Bent Knee Raise  5 reps    Isometric Hip Flexion  5 reps    Other Supine Lumbar Exercises  single leg lowering 1/2 way down 5x       Knee/Hip Exercises: Stretches   Hip Flexor Stretch Limitations  hip internal stretch in supine 5x bil from fig 4 position     Piriformis Stretch  Both;3 reps;30 seconds with green ball    Other Knee/Hip Stretches  rainbow stretches bil overhead 10x with foot on 2nd step     Other Knee/Hip Stretches  knee in chair elbow to knee 5x rght /left      Knee/Hip Exercises: Standing   Other Standing Knee Exercises  red band around thighs hip extension 10x bil     Other Standing Knee Exercises  wall psoas march with red band  around thighs       Knee/Hip Exercises: Supine   Bridges with Ball Squeeze  10 reps      Knee/Hip Exercises: Sidelying   Hip ADduction  AROM;Right;Left;10 reps      Manual Therapy   Joint Mobilization  right hip long axis distraction, inferior mob grade 3 3x 20 sec; prone PA, PA in internal rotation and PA in external rotation grade 3 3x 20 sec               PT Short Term Goals - 10/19/17 1531      PT SHORT TERM GOAL #1   Title  The patient will demonstrate knowledge of initial HEP for ROM and strengthening    Status  Achieved      PT SHORT TERM GOAL #2   Title  The patient will report a 30% improvement in pain with walking and putting on socks/shoes    Status  Achieved      PT SHORT TERM GOAL #3   Title  The patient will  have improved hip external rotation to 30 degrees and internal rotation to 15 degrees for greater ease putting on socks/shoes    Status  Achieved        PT Long Term Goals - 09/28/17 0907      PT LONG TERM GOAL #1   Title  The patient will be independent in safe self progression of HEP for further improvements in ROM, strength and pain reduction    Time  8    Period  Weeks    Status  New    Target Date  11/23/17      PT LONG TERM GOAL #2   Title  The patient will report a 60%  improvement in right hip pain with walking, work tasks and putting on socks and shoes    Time  8    Period  Weeks    Status  New      PT LONG TERM GOAL #3   Title  The patient will have right hip strength of at least 4 to 4+/5 needed for standing and walking longer periods of time    Time  8    Period  Weeks    Status  New      PT LONG TERM GOAL #4   Title  FOTO functional outcome score improved from 42% limitation to 30% indicating improved function with less pain     Time  8    Period  Weeks    Status  New            Plan - 10/24/17 4967    Clinical Impression Statement  The patient reports her original right hip/groin pain has greatly improved.  She  reports a flare up of an old right knee pain issue over the weekend so we avoided kneeling and quadruped positions today.   She denies groin or knee pain today and she reports stretching and hip mobilizations feel really good.  She continues to make steady progress toward goals.  Iontophoresis series was completed last week.      Rehab Potential  Good    Clinical Impairments Affecting Rehab Potential  none;  history of bil plantar fascitis    PT Frequency  2x / week    PT Duration  8 weeks    PT Treatment/Interventions  ADLs/Self Care Home Management;Iontophoresis 4mg /ml Dexamethasone;Cryotherapy;Electrical Stimulation;Ultrasound;Moist Heat;Therapeutic activities;Therapeutic exercise;Patient/family education;Neuromuscular re-education;Dry needling;Manual techniques;Taping    PT Next Visit Plan    right hip flexor stretching;  gluteus medius/max strengthening;  core strengthening;  hip manual therapy;  hip internal and external rotation ROM    PT Home Exercise Plan  [id number]        Patient will benefit from skilled therapeutic intervention in order to improve the following deficits and impairments:  Pain, Decreased strength, Impaired flexibility, Decreased range of motion  Visit Diagnosis: Pain in right hip  Stiffness of right hip, not elsewhere classified  Muscle weakness (generalized)     Problem List Patient Active Problem List   Diagnosis Date Noted  . HYPERTENSION 09/01/2009  . CHEST PAIN 09/01/2009   Ruben Im, PT 10/24/17 9:01 AM Phone: [phone] Fax: [phone]  [name] 10/24/2017, 9:00 AM  [facility] [address], [place], [place], [address] Phone: [phone]   Fax:  [phone]  Name: [name] MRN: [id number] Date of Birth: 12-May-1970

## 2017-10-31 ENCOUNTER — Ambulatory Visit: Admitting: Physical Therapy

## 2017-11-02 ENCOUNTER — Ambulatory Visit: Admitting: Physical Therapy

## 2017-11-10 ENCOUNTER — Ambulatory Visit: Admitting: Physical Therapy

## 2017-11-10 ENCOUNTER — Encounter: Payer: Self-pay | Admitting: Physical Therapy

## 2017-11-10 DIAGNOSIS — M25551 Pain in right hip: Secondary | ICD-10-CM

## 2017-11-10 DIAGNOSIS — M6281 Muscle weakness (generalized): Secondary | ICD-10-CM

## 2017-11-10 DIAGNOSIS — M25651 Stiffness of right hip, not elsewhere classified: Secondary | ICD-10-CM

## 2017-11-10 NOTE — Therapy (Signed)
[facility] [address] [place], [place], [address] Phone: [phone]   Fax:  [phone]  Physical Therapy Treatment  Patient Details  Name: [name] MRN: [id number] Date of Birth: 05/11/1970 Referring Provider: Dr. Jonathon Jordan   Encounter Date: 11/10/2017  PT End of Session - 11/10/17 1349    Visit Number  9    Date for PT Re-Evaluation  11/23/17    Authorization Type  tricare    PT Start Time  0800    PT Stop Time  0843    PT Time Calculation (min)  43 min    Activity Tolerance  Patient tolerated treatment well       Past Medical History:  Diagnosis Date  . Preeclampsia     History reviewed. No pertinent surgical history.  There were no vitals filed for this visit.  Subjective Assessment - 11/10/17 0759    Subjective  I did a lot of walking on my trip.  A little knee pain but hip was OK.      Currently in Pain?  No/denies    Pain Score  0-No pain    Pain Location  Hip    Pain Orientation  Right    Pain Type  Chronic pain    Aggravating Factors   not feeling hip when getting in the car          [facility] PT Assessment - 11/10/17 0001      Strength   Right Hip Flexion  5/5    Right Hip Extension  4+/5    Right Hip External Rotation   4/5    Right Hip Internal Rotation  4/5    Right Hip ABduction  4/5    Left Hip Flexion  5/5    Left Hip Extension  4+/5    Left Hip External Rotation  5/5    Left Hip Internal Rotation  5/5    Left Hip ABduction  4+/5                   [facility] Adult PT Treatment/Exercise - 11/10/17 0001      Lumbar Exercises: Supine   Bent Knee Raise  10 reps    Isometric Hip Flexion  10 reps    Other Supine Lumbar Exercises  single leg lowering 1/2 way down 5x       Knee/Hip Exercises: Stretches   Other Knee/Hip Stretches  rainbow stretches bil overhead 10x with foot on 2nd step     Other Knee/Hip Stretches  knee in chair elbow to knee 5x rght /left      Knee/Hip Exercises: Aerobic   Nustep  10 min L3      Knee/Hip Exercises: Supine   Bridges with [employer]  10 reps    Bridges with Clams[employer]  Strengthening;Both;10 reps green band    Other Supine Knee/Hip Exercises  hand to knee push isometric with abdominal brace 10x      Manual Therapy   Joint Mobilization  right hip long axis distraction, inferior mob grade 3 3x 20 sec; prone PA, PA in internal rotation and PA in external rotation grade 3 3x 20 sec               PT Short Term Goals - 11/10/17 1353      PT SHORT TERM GOAL #1   Title  The patient will demonstrate knowledge of initial HEP for ROM and strengthening    Status  Achieved  PT SHORT TERM GOAL #2   Title  The patient will report a 30% improvement in pain with walking and putting on socks/shoes    Status  Achieved      PT SHORT TERM GOAL #3   Title  The patient will have improved hip external rotation to 30 degrees and internal rotation to 15 degrees for greater ease putting on socks/shoes    Status  Achieved        PT Long Term Goals - 11/10/17 1353      PT LONG TERM GOAL #1   Title  The patient will be independent in safe self progression of HEP for further improvements in ROM, strength and pain reduction    Time  8    Period  Weeks    Status  On-going      PT LONG TERM GOAL #2   Title  The patient will report a 60%  improvement in right hip pain with walking, work tasks and putting on socks and shoes    Time  8    Period  Weeks    Status  On-going      PT LONG TERM GOAL #3   Title  The patient will have right hip strength of at least 4 to 4+/5 needed for standing and walking longer periods of time    Time  8    Period  Weeks    Status  On-going      PT LONG TERM GOAL #4   Title  FOTO functional outcome score improved from 42% limitation to 30% indicating improved function with less pain     Time  8    Period  Weeks    Status  On-going            Plan - 11/10/17 1350    Clinical  Impression Statement  The patient is progressing well with pain reduction in hip, ROM and strength.  No knee pain with avoidance of kneeling positions.  Improved right hip joint mobility.  Therapist closely monitoring response with all treatment interventions.      Rehab Potential  Good    Clinical Impairments Affecting Rehab Potential  none;  history of bil plantar fascitis    PT Frequency  2x / week    PT Duration  8 weeks    PT Treatment/Interventions  ADLs/Self Care Home Management;Iontophoresis 4mg /ml Dexamethasone;Cryotherapy;Electrical Stimulation;Ultrasound;Moist Heat;Therapeutic activities;Therapeutic exercise;Patient/family education;Neuromuscular re-education;Dry needling;Manual techniques;Taping    PT Next Visit Plan   check FOTO;  check ROM;   right hip flexor stretching;  gluteus medius/max strengthening;  core strengthening;  hip manual therapy;  hip internal and external rotation ROM    PT Home Exercise Plan  [id number]        Patient will benefit from skilled therapeutic intervention in order to improve the following deficits and impairments:  Pain, Decreased strength, Impaired flexibility, Decreased range of motion  Visit Diagnosis: Pain in right hip  Stiffness of right hip, not elsewhere classified  Muscle weakness (generalized)     Problem List Patient Active Problem List   Diagnosis Date Noted  . HYPERTENSION 09/01/2009  . CHEST PAIN 09/01/2009   Ruben Im, PT 11/10/17 1:55 PM Phone: [phone] Fax: [phone]  [name] 11/10/2017, 1:54 PM  [facility] [address] [place], [place], [address] Phone: [phone]   Fax:  [phone]  Name: [name] MRN: [id number] Date of Birth: February 22, 1971

## 2017-11-14 ENCOUNTER — Ambulatory Visit: Admitting: Physical Therapy

## 2017-11-14 ENCOUNTER — Encounter: Payer: Self-pay | Admitting: Physical Therapy

## 2017-11-14 DIAGNOSIS — M25551 Pain in right hip: Secondary | ICD-10-CM

## 2017-11-14 DIAGNOSIS — M25651 Stiffness of right hip, not elsewhere classified: Secondary | ICD-10-CM

## 2017-11-14 DIAGNOSIS — M6281 Muscle weakness (generalized): Secondary | ICD-10-CM

## 2017-11-14 NOTE — Therapy (Signed)
[facility] [address] [place], [place], [address] Phone: [phone]   Fax:  [phone]  Physical Therapy Treatment/Discharge Summary   Patient Details  Name: [name] MRN: [id number] Date of Birth: 07/01/1970 Referring Provider: Dr. Jonathon Jordan   Encounter Date: 11/14/2017  PT End of Session - 11/14/17 0729    Visit Number  10    Date for PT Re-Evaluation  11/23/17    Authorization Type  tricare    PT Start Time  0729    PT Stop Time  0800 30 min appt slot    PT Time Calculation (min)  31 min    Activity Tolerance  Patient tolerated treatment well       Past Medical History:  Diagnosis Date  . Preeclampsia     History reviewed. No pertinent surgical history.  There were no vitals filed for this visit.  Subjective Assessment - 11/14/17 0731    Subjective  Trying to get back on track on after vacation.  No hip pain.  The knee is OK but I can feel achiness when it rains.      Patient Stated Goals  feel better; be more mobile    Currently in Pain?  No/denies    Pain Score  0-No pain    Pain Location  Hip    Pain Orientation  Right    Pain Type  Chronic pain         [facility] PT Assessment - 11/14/17 0001      Observation/Other Assessments   Focus on Therapeutic Outcomes (FOTO)   2%       AROM   Right Hip Flexion  123    Right Hip External Rotation   32    Right Hip Internal Rotation   30      Strength   Right Hip Flexion  5/5    Right Hip Extension  4+/5    Right Hip External Rotation   4+/5    Right Hip Internal Rotation  4+/5    Right Hip ABduction  4+/5      Flexibility   Hamstrings  90 right                   [facility] Adult PT Treatment/Exercise - 11/14/17 0001      Knee/Hip Exercises: Stretches   Hip Flexor Stretch Limitations  hip internal stretch in supine 5x bil from fig 4 position     Piriformis Stretch  Both;3 reps;30 seconds with green ball    Other Knee/Hip  Stretches  rainbow stretches bil overhead 10x with foot on 2nd step     Other Knee/Hip Stretches  frog leg stretch with green ball 5x      Knee/Hip Exercises: Aerobic   Nustep  10 min L3 while discussing progress toward goals      Knee/Hip Exercises: Standing   Other Standing Knee Exercises  review of HEP with focus on gluteus medius strengthening and best stretches (hip flexor and rotator)                PT Short Term Goals - 11/14/17 0755      PT SHORT TERM GOAL #1   Title  The patient will demonstrate knowledge of initial HEP for ROM and strengthening    Status  Achieved      PT SHORT TERM GOAL #2   Title  The patient will report a 30% improvement in pain with walking and putting on  socks/shoes    Status  Achieved      PT SHORT TERM GOAL #3   Title  The patient will have improved hip external rotation to 30 degrees and internal rotation to 15 degrees for greater ease putting on socks/shoes    Status  Achieved        PT Long Term Goals - 11/14/17 0755      PT LONG TERM GOAL #1   Title  The patient will be independent in safe self progression of HEP for further improvements in ROM, strength and pain reduction    Status  Achieved      PT LONG TERM GOAL #2   Title  The patient will report a 60%  improvement in right hip pain with walking, work tasks and putting on socks and shoes    Status  Achieved      PT LONG TERM GOAL #3   Title  The patient will have right hip strength of at least 4 to 4+/5 needed for standing and walking longer periods of time    Status  Achieved      PT LONG TERM GOAL #4   Title  FOTO functional outcome score improved from 42% limitation to 30% indicating improved function with less pain     Status  Achieved            Plan - 11/14/17 1133    Clinical Impression Statement  The patient has made excellent progress with PT with right hip/groin pain reduction.  She no longer complains of pain getting in/out of the car or with any of her  usual ADLS.  Her hip ROM has significantly improved in all planes of motion and is now [facility].  She has also improved with hip strength and is grossly 4+/5 throughout.  Her FOTO functional outcome score has decreased significantly from 42% limitation to 2%.  She has met all STGS and LTGS.  She is independent in a safe self progression of HEP to address her few remaining deficits.  Will discharge from PT at this time.         Patient will benefit from skilled therapeutic intervention in order to improve the following deficits and impairments:     Visit Diagnosis: Pain in right hip  Stiffness of right hip, not elsewhere classified  Muscle weakness (generalized)     PHYSICAL THERAPY DISCHARGE SUMMARY  Visits from Start of Care: 10  Current functional level related to goals / functional outcomes: See clinical impressions above   Remaining deficits: As above  Education / Equipment: Comprehensive HEP Plan: Patient agrees to discharge.  Patient goals were met. Patient is being discharged due to meeting the stated rehab goals.  ?????        Problem List Patient Active Problem List   Diagnosis Date Noted  . HYPERTENSION 09/01/2009  . CHEST PAIN 09/01/2009   Ruben Im, PT 11/14/17 11:39 AM Phone: [phone] Fax: [phone]  [name] 11/14/2017, 11:38 AM  [facility] [address], [place], [place], [address] Phone: [phone]   Fax:  [phone]  Name: [name] MRN: [id number] Date of Birth: 25-Mar-1971

## 2017-11-16 ENCOUNTER — Encounter: Admitting: Physical Therapy

## 2017-11-21 ENCOUNTER — Encounter: Admitting: Physical Therapy

## 2017-11-23 ENCOUNTER — Encounter: Admitting: Physical Therapy

## 2018-04-09 ENCOUNTER — Other Ambulatory Visit: Payer: Self-pay | Admitting: Family Medicine

## 2018-04-09 DIAGNOSIS — E042 Nontoxic multinodular goiter: Secondary | ICD-10-CM

## 2018-04-11 ENCOUNTER — Ambulatory Visit
Admission: RE | Admit: 2018-04-11 | Discharge: 2018-04-11 | Disposition: A | Discharge status: Home / Self Care | Source: Ambulatory Visit | Attending: Family Medicine | Admitting: Family Medicine

## 2018-04-11 DIAGNOSIS — E042 Nontoxic multinodular goiter: Secondary | ICD-10-CM

## 2018-06-14 ENCOUNTER — Other Ambulatory Visit: Payer: Self-pay | Admitting: Obstetrics and Gynecology

## 2018-06-20 ENCOUNTER — Other Ambulatory Visit: Payer: Self-pay | Admitting: Obstetrics and Gynecology

## 2018-06-21 NOTE — Patient Instructions (Signed)
[name]  06/21/2018      Your procedure is scheduled on 06-28-2018  Report to [place]  at  [place].M.  Call this number if you have problems the morning of [phone]  OUR ADDRESS IS [address], WE ARE LOCATED [facility] WITH [facility].   Remember:   Do not eat food or drink liquids after midnight.  Take these medicines the morning of surgery with A SIP OF WATER:NONE    Do not wear jewelry, make-up or nail polish.  Do not wear lotions, powders, or perfumes, or deoderant.  Do not shave 48 hours prior to surgery.  Men may shave face and neck.  Do not bring valuables to the hospital.  [facility] is not responsible for any belongings or valuables.  Contacts, dentures or bridgework may not be worn into surgery.  Leave your suitcase in the car.  After surgery it may be brought to your room.  For patients admitted to the hospital, discharge time will be determined by your treatment team.  Patients discharged the day of surgery will not be allowed to drive home.   Special instructions:  [facility] - Preparing for Surgery Before surgery, you can play an important role.  Because skin is not sterile, your skin needs to be as free of germs as possible.  You can reduce the number of germs on your skin by washing with CHG (chlorahexidine gluconate) soap before surgery.  CHG is an antiseptic cleaner which kills germs and bonds with the skin to continue killing germs even after washing. Please DO NOT use if you have an allergy to CHG or antibacterial soaps.  If your skin becomes reddened/irritated stop using the CHG and inform your nurse when you arrive at Short Stay. Do not shave (including legs and underarms) for at least 48 hours prior to the first CHG shower.  You may shave your face/neck. Please follow these instructions carefully:  1.  Shower with CHG Soap the night before surgery and the  morning of Surgery.  2.  If you choose to  wash your hair, wash your hair first as usual with your  normal  shampoo.  3.  After you shampoo, rinse your hair and body thoroughly to remove the  shampoo.                           4.  Use CHG as you would any other liquid soap.  You can apply chg directly  to the skin and wash                       Gently with a scrungie or clean washcloth.  5.  Apply the CHG Soap to your body ONLY FROM THE NECK DOWN.   Do not use on face/ open                           Wound or open sores. Avoid contact with eyes, ears mouth and genitals (private parts).                       Wash face,  Genitals (private parts) with your normal soap.             6.  Wash thoroughly, paying special attention to the area where your surgery  will be performed.  7.  Thoroughly rinse your body with warm water from the neck down.  8.  DO NOT shower/wash with your normal soap after using and rinsing off  the CHG Soap.                9.  Pat yourself dry with a clean towel.            10.  Wear clean pajamas.            11.  Place clean sheets on your bed the night of your first shower and do not  sleep with pets. Day of Surgery : Do not apply any lotions/deodorants the morning of surgery.  Please wear clean clothes to the hospital/surgery center.  FAILURE TO FOLLOW THESE INSTRUCTIONS MAY RESULT IN THE CANCELLATION OF YOUR SURGERY PATIENT SIGNATURE_________________________________  NURSE SIGNATURE__________________________________  ________________________________________________________________________   Please read over the following fact sheets that you were given:

## 2018-06-22 ENCOUNTER — Encounter (HOSPITAL_COMMUNITY): Payer: Self-pay

## 2018-06-22 ENCOUNTER — Encounter (HOSPITAL_COMMUNITY)
Admission: RE | Admit: 2018-06-22 | Discharge: 2018-06-22 | Disposition: A | Discharge status: Home / Self Care | Source: Ambulatory Visit | Attending: Obstetrics and Gynecology | Admitting: Obstetrics and Gynecology

## 2018-06-22 ENCOUNTER — Other Ambulatory Visit: Payer: Self-pay

## 2018-06-22 DIAGNOSIS — D259 Leiomyoma of uterus, unspecified: Secondary | ICD-10-CM | POA: Diagnosis not present

## 2018-06-22 DIAGNOSIS — Z01812 Encounter for preprocedural laboratory examination: Secondary | ICD-10-CM | POA: Diagnosis present

## 2018-06-22 HISTORY — DX: Leiomyoma of uterus, unspecified: D25.9

## 2018-06-22 HISTORY — DX: Urinary tract infection, site not specified: N39.0

## 2018-06-22 HISTORY — DX: Anemia, unspecified: D64.9

## 2018-06-22 LAB — BASIC METABOLIC PANEL
Anion gap: 5 (ref 5–15)
BUN: 11 mg/dL (ref 6–20)
CO2: 25 mmol/L (ref 22–32)
Calcium: 9.1 mg/dL (ref 8.9–10.3)
Chloride: 106 mmol/L (ref 98–111)
Creatinine, Ser: 0.78 mg/dL (ref 0.44–1.00)
GFR calc non Af Amer: >60 mL/min (ref >60)
Glucose, Bld: 77 mg/dL (ref 70–99)
Potassium: 4.1 mmol/L (ref 3.5–5.1)
Sodium: 136 mmol/L (ref 135–145)

## 2018-06-22 LAB — HCG, SERUM, QUALITATIVE: Preg, Serum: NEGATIVE

## 2018-06-22 LAB — CBC
HCT: 39.6 % (ref 36.0–46.0)
Hemoglobin: 12.7 g/dL (ref 12.0–15.0)
MCH: 30.8 pg (ref 26.0–34.0)
MCHC: 32.1 g/dL (ref 30.0–36.0)
MCV: 96.1 fL (ref 80.0–100.0)
NRBC: 0 % (ref 0.0–0.2)
Platelets: 279 10*3/uL (ref 150–400)
RBC: 4.12 MIL/uL (ref 3.87–5.11)
RDW: 12.5 % (ref 11.5–15.5)
WBC: 5.1 10*3/uL (ref 4.0–10.5)

## 2018-06-27 MED ORDER — DEXTROSE 5 % IV SOLN
3.0000 g | INTRAVENOUS | Status: AC
  Administered 2018-06-28: 3 g via INTRAVENOUS
  Filled 2018-06-27: qty 3

## 2018-06-28 ENCOUNTER — Inpatient Hospital Stay (HOSPITAL_COMMUNITY)
Admission: RE | Admit: 2018-06-28 | Discharge: 2018-06-30 | DRG: 743 | Disposition: A | Discharge status: Home / Self Care | Attending: Obstetrics and Gynecology | Admitting: Obstetrics and Gynecology

## 2018-06-28 ENCOUNTER — Encounter (HOSPITAL_COMMUNITY)
Admission: RE | Disposition: A | Discharge status: Home / Self Care | Payer: Self-pay | Source: Home / Self Care | Attending: Obstetrics and Gynecology

## 2018-06-28 ENCOUNTER — Other Ambulatory Visit: Payer: Self-pay

## 2018-06-28 ENCOUNTER — Inpatient Hospital Stay (HOSPITAL_COMMUNITY): Admitting: Certified Registered Nurse Anesthetist

## 2018-06-28 ENCOUNTER — Inpatient Hospital Stay (HOSPITAL_COMMUNITY): Admitting: Physician Assistant

## 2018-06-28 ENCOUNTER — Encounter (HOSPITAL_COMMUNITY): Payer: Self-pay | Admitting: Certified Registered Nurse Anesthetist

## 2018-06-28 DIAGNOSIS — E669 Obesity, unspecified: Secondary | ICD-10-CM | POA: Diagnosis present

## 2018-06-28 DIAGNOSIS — N920 Excessive and frequent menstruation with regular cycle: Secondary | ICD-10-CM | POA: Diagnosis present

## 2018-06-28 DIAGNOSIS — D259 Leiomyoma of uterus, unspecified: Secondary | ICD-10-CM | POA: Diagnosis present

## 2018-06-28 DIAGNOSIS — Z885 Allergy status to narcotic agent status: Secondary | ICD-10-CM

## 2018-06-28 DIAGNOSIS — Z90710 Acquired absence of both cervix and uterus: Secondary | ICD-10-CM | POA: Diagnosis present

## 2018-06-28 HISTORY — PX: HYSTERECTOMY ABDOMINAL WITH SALPINGECTOMY: SHX6725

## 2018-06-28 SURGERY — HYSTERECTOMY, TOTAL, ABDOMINAL, WITH SALPINGECTOMY
Anesthesia: General | Site: Abdomen

## 2018-06-28 MED ORDER — PROPOFOL 10 MG/ML IV BOLUS
INTRAVENOUS | Status: AC
  Filled 2018-06-28: qty 60

## 2018-06-28 MED ORDER — DIPHENHYDRAMINE HCL 50 MG/ML IJ SOLN: 12.5000 mg | Freq: Four times a day (QID) | INTRAMUSCULAR | Status: DC | PRN

## 2018-06-28 MED ORDER — VASOPRESSIN 20 UNIT/ML IV SOLN
INTRAVENOUS | Status: AC
  Filled 2018-06-28: qty 3

## 2018-06-28 MED ORDER — 0.9 % SODIUM CHLORIDE (POUR BTL) OPTIME
TOPICAL | Status: DC | PRN
  Administered 2018-06-28: 2000 mL

## 2018-06-28 MED ORDER — HYDROMORPHONE 1 MG/ML IV SOLN
INTRAVENOUS | Status: DC
  Administered 2018-06-28: 1 mg via INTRAVENOUS
  Administered 2018-06-28: 30 mg via INTRAVENOUS
  Administered 2018-06-28 (×2): 0.2 mg via INTRAVENOUS
  Administered 2018-06-29: 1.2 mg via INTRAVENOUS
  Administered 2018-06-29: 0.8 mg via INTRAVENOUS
  Filled 2018-06-28: qty 30

## 2018-06-28 MED ORDER — ONDANSETRON HCL 4 MG/2ML IJ SOLN
4.0000 mg | Freq: Four times a day (QID) | INTRAMUSCULAR | Status: DC | PRN
  Administered 2018-06-28: 4 mg via INTRAVENOUS
  Filled 2018-06-28: qty 2

## 2018-06-28 MED ORDER — MENTHOL 3 MG MT LOZG: 1.0000 | LOZENGE | OROMUCOSAL | Status: DC | PRN

## 2018-06-28 MED ORDER — HYDROMORPHONE HCL 1 MG/ML IJ SOLN
0.2500 mg | INTRAMUSCULAR | Status: DC | PRN
  Administered 2018-06-28: 0.5 mg via INTRAVENOUS

## 2018-06-28 MED ORDER — ONDANSETRON HCL 4 MG PO TABS: 4.0000 mg | ORAL_TABLET | Freq: Four times a day (QID) | ORAL | Status: DC | PRN

## 2018-06-28 MED ORDER — MIDAZOLAM HCL 5 MG/5ML IJ SOLN
INTRAMUSCULAR | Status: DC | PRN
  Administered 2018-06-28 (×2): 1 mg via INTRAVENOUS

## 2018-06-28 MED ORDER — SUFENTANIL CITRATE 50 MCG/ML IV SOLN
INTRAVENOUS | Status: DC | PRN
  Administered 2018-06-28: 10 ug via INTRAVENOUS
  Administered 2018-06-28 (×2): 5 ug via INTRAVENOUS
  Administered 2018-06-28: 10 ug via INTRAVENOUS
  Administered 2018-06-28: 5 ug via INTRAVENOUS
  Administered 2018-06-28 (×2): 10 ug via INTRAVENOUS

## 2018-06-28 MED ORDER — DEXTROSE IN LACTATED RINGERS 5 % IV SOLN
INTRAVENOUS | Status: DC
  Administered 2018-06-28 – 2018-06-29 (×2): via INTRAVENOUS

## 2018-06-28 MED ORDER — METOCLOPRAMIDE HCL 5 MG/ML IJ SOLN
INTRAMUSCULAR | Status: DC | PRN
  Administered 2018-06-28: 5 mg via INTRAVENOUS

## 2018-06-28 MED ORDER — ROCURONIUM BROMIDE 100 MG/10ML IV SOLN
INTRAVENOUS | Status: DC | PRN
  Administered 2018-06-28: 5 mg via INTRAVENOUS
  Administered 2018-06-28: 60 mg via INTRAVENOUS
  Administered 2018-06-28: 5 mg via INTRAVENOUS

## 2018-06-28 MED ORDER — SUGAMMADEX SODIUM 500 MG/5ML IV SOLN
INTRAVENOUS | Status: DC | PRN
  Administered 2018-06-28: 200 mg via INTRAVENOUS

## 2018-06-28 MED ORDER — SUFENTANIL CITRATE 50 MCG/ML IV SOLN
INTRAVENOUS | Status: AC
  Filled 2018-06-28: qty 1

## 2018-06-28 MED ORDER — EPHEDRINE SULFATE 50 MG/ML IJ SOLN
INTRAMUSCULAR | Status: DC | PRN
  Administered 2018-06-28: 5 mg via INTRAVENOUS

## 2018-06-28 MED ORDER — HYDROMORPHONE HCL 2 MG PO TABS
4.0000 mg | ORAL_TABLET | ORAL | Status: DC | PRN
  Administered 2018-06-29: 4 mg via ORAL
  Filled 2018-06-28: qty 2

## 2018-06-28 MED ORDER — PROPOFOL 500 MG/50ML IV EMUL
INTRAVENOUS | Status: DC | PRN
  Administered 2018-06-28: 150 ug/kg/min via INTRAVENOUS

## 2018-06-28 MED ORDER — BUPIVACAINE HCL (PF) 0.25 % IJ SOLN
INTRAMUSCULAR | Status: AC
  Filled 2018-06-28: qty 30

## 2018-06-28 MED ORDER — SIMETHICONE 80 MG PO CHEW: 80.0000 mg | CHEWABLE_TABLET | Freq: Four times a day (QID) | ORAL | Status: DC | PRN

## 2018-06-28 MED ORDER — DEXAMETHASONE SODIUM PHOSPHATE 10 MG/ML IJ SOLN
INTRAMUSCULAR | Status: DC | PRN
  Administered 2018-06-28: 10 mg via INTRAVENOUS

## 2018-06-28 MED ORDER — DIPHENHYDRAMINE HCL 12.5 MG/5ML PO ELIX: 12.5000 mg | ORAL_SOLUTION | Freq: Four times a day (QID) | ORAL | Status: DC | PRN

## 2018-06-28 MED ORDER — SODIUM CHLORIDE 0.9% FLUSH: 9.0000 mL | INTRAVENOUS | Status: DC | PRN

## 2018-06-28 MED ORDER — ONDANSETRON HCL 4 MG/2ML IJ SOLN
INTRAMUSCULAR | Status: DC | PRN
  Administered 2018-06-28: 4 mg via INTRAVENOUS

## 2018-06-28 MED ORDER — ONDANSETRON HCL 4 MG/2ML IJ SOLN: 4.0000 mg | Freq: Once | INTRAMUSCULAR | Status: DC | PRN

## 2018-06-28 MED ORDER — ACETAMINOPHEN 10 MG/ML IV SOLN
INTRAVENOUS | Status: DC | PRN
  Administered 2018-06-28: 1000 mg via INTRAVENOUS

## 2018-06-28 MED ORDER — PROPOFOL 10 MG/ML IV BOLUS
INTRAVENOUS | Status: DC | PRN
  Administered 2018-06-28: 200 mg via INTRAVENOUS

## 2018-06-28 MED ORDER — PROPOFOL 10 MG/ML IV BOLUS
INTRAVENOUS | Status: AC
  Filled 2018-06-28: qty 20

## 2018-06-28 MED ORDER — ACETAMINOPHEN 10 MG/ML IV SOLN
INTRAVENOUS | Status: AC
  Filled 2018-06-28: qty 100

## 2018-06-28 MED ORDER — MEPERIDINE HCL 50 MG/ML IJ SOLN: 6.2500 mg | INTRAMUSCULAR | Status: DC | PRN

## 2018-06-28 MED ORDER — ONDANSETRON HCL 4 MG/2ML IJ SOLN: 4.0000 mg | Freq: Four times a day (QID) | INTRAMUSCULAR | Status: DC | PRN

## 2018-06-28 MED ORDER — HYDROMORPHONE HCL 1 MG/ML IJ SOLN
INTRAMUSCULAR | Status: AC
  Filled 2018-06-28: qty 1

## 2018-06-28 MED ORDER — SODIUM CHLORIDE (PF) 0.9 % IJ SOLN
INTRAMUSCULAR | Status: AC
  Filled 2018-06-28: qty 100

## 2018-06-28 MED ORDER — PHENYLEPHRINE HCL 10 MG/ML IJ SOLN
INTRAMUSCULAR | Status: DC | PRN
  Administered 2018-06-28 (×2): 40 ug via INTRAVENOUS

## 2018-06-28 MED ORDER — LACTATED RINGERS IV SOLN
INTRAVENOUS | Status: DC
  Administered 2018-06-28 (×2): via INTRAVENOUS

## 2018-06-28 MED ORDER — PANTOPRAZOLE SODIUM 40 MG PO TBEC
40.0000 mg | DELAYED_RELEASE_TABLET | Freq: Every day | ORAL | Status: DC
  Administered 2018-06-28 – 2018-06-29 (×2): 40 mg via ORAL
  Filled 2018-06-28 (×2): qty 1

## 2018-06-28 MED ORDER — NALOXONE HCL 0.4 MG/ML IJ SOLN: 0.4000 mg | INTRAMUSCULAR | Status: DC | PRN

## 2018-06-28 MED ORDER — LIDOCAINE HCL (CARDIAC) PF 100 MG/5ML IV SOSY
PREFILLED_SYRINGE | INTRAVENOUS | Status: DC | PRN
  Administered 2018-06-28: 100 mg via INTRAVENOUS

## 2018-06-28 MED ORDER — FENTANYL CITRATE (PF) 100 MCG/2ML IJ SOLN
INTRAMUSCULAR | Status: DC | PRN
  Administered 2018-06-28 (×2): 50 ug via INTRAVENOUS

## 2018-06-28 MED ORDER — MIDAZOLAM HCL 2 MG/2ML IJ SOLN
INTRAMUSCULAR | Status: AC
  Filled 2018-06-28: qty 2

## 2018-06-28 MED ORDER — FENTANYL CITRATE (PF) 100 MCG/2ML IJ SOLN
INTRAMUSCULAR | Status: AC
  Filled 2018-06-28: qty 2

## 2018-06-28 MED ORDER — BUPIVACAINE HCL (PF) 0.25 % IJ SOLN
INTRAMUSCULAR | Status: DC | PRN
  Administered 2018-06-28: 8 mL

## 2018-06-28 MED ORDER — SCOPOLAMINE 1 MG/3DAYS TD PT72
1.0000 | MEDICATED_PATCH | TRANSDERMAL | Status: DC
  Administered 2018-06-28: 1.5 mg via TRANSDERMAL
  Filled 2018-06-28: qty 1

## 2018-06-28 MED ORDER — IBUPROFEN 200 MG PO TABS
800.0000 mg | ORAL_TABLET | Freq: Three times a day (TID) | ORAL | Status: DC
  Administered 2018-06-28 – 2018-06-30 (×6): 800 mg via ORAL
  Filled 2018-06-28 (×6): qty 4

## 2018-06-28 SURGICAL SUPPLY — 53 items
BAG URINE DRAINAGE (UROLOGICAL SUPPLIES) ×3 IMPLANT
BENZOIN TINCTURE PRP APPL 2/3 (GAUZE/BANDAGES/DRESSINGS) ×3 IMPLANT
CANISTER SUCT 3000ML PPV (MISCELLANEOUS) IMPLANT
CATH FOLEY 3WAY  5CC 16FR (CATHETERS) ×2
CATH FOLEY 3WAY 5CC 16FR (CATHETERS) ×1 IMPLANT
CLOSURE WOUND 1/2 X4 (GAUZE/BANDAGES/DRESSINGS) ×1
CONT PATH 16OZ SNAP LID 3702 (MISCELLANEOUS) ×3 IMPLANT
CONT SPEC 4OZ CLIKSEAL STRL BL (MISCELLANEOUS) ×3 IMPLANT
COVER WAND RF STERILE (DRAPES) ×3 IMPLANT
DECANTER SPIKE VIAL GLASS SM (MISCELLANEOUS) ×3 IMPLANT
DRAPE CESAREAN BIRTH W POUCH (DRAPES) IMPLANT
DRAPE WARM FLUID 44X44 (DRAPE) ×3 IMPLANT
DRSG OPSITE POSTOP 4X10 (GAUZE/BANDAGES/DRESSINGS) ×3 IMPLANT
DURAPREP 26ML APPLICATOR (WOUND CARE) ×3 IMPLANT
ELECT BLADE TIP CTD 4 INCH (ELECTRODE) ×3 IMPLANT
GAUZE 4X4 16PLY RFD (DISPOSABLE) ×3 IMPLANT
GLOVE BIOGEL PI IND STRL 6.5 (GLOVE) ×1 IMPLANT
GLOVE BIOGEL PI IND STRL 7.0 (GLOVE) ×4 IMPLANT
GLOVE BIOGEL PI IND STRL 7.5 (GLOVE) ×2 IMPLANT
GLOVE BIOGEL PI INDICATOR 6.5 (GLOVE) ×2
GLOVE BIOGEL PI INDICATOR 7.0 (GLOVE) ×8
GLOVE BIOGEL PI INDICATOR 7.5 (GLOVE) ×4
GLOVE ECLIPSE 6.5 STRL STRAW (GLOVE) ×3 IMPLANT
GLOVE SURG SS PI 6.0 STRL IVOR (GLOVE) ×3 IMPLANT
GLOVE SURG SS PI 7.0 STRL IVOR (GLOVE) ×6 IMPLANT
GOWN STRL REUS W/TWL LRG LVL3 (GOWN DISPOSABLE) ×12 IMPLANT
HEMOSTAT ARISTA ABSORB 3G PWDR (HEMOSTASIS) ×3 IMPLANT
LIGASURE IMPACT 36 18CM CVD LR (INSTRUMENTS) ×3 IMPLANT
NEEDLE HYPO 22GX1.5 SAFETY (NEEDLE) ×3 IMPLANT
PACK GENERAL/GYN (CUSTOM PROCEDURE TRAY) ×3 IMPLANT
PAD OB MATERNITY 4.3X12.25 (PERSONAL CARE ITEMS) ×3 IMPLANT
PLUG CATH AND CAP STER (CATHETERS) ×3 IMPLANT
PROTECTOR NERVE ULNAR (MISCELLANEOUS) IMPLANT
RTRCTR C-SECT PINK 25CM LRG (MISCELLANEOUS) ×3 IMPLANT
SPONGE LAP 18X18 RF (DISPOSABLE) ×6 IMPLANT
STRIP CLOSURE SKIN 1/2X4 (GAUZE/BANDAGES/DRESSINGS) ×2 IMPLANT
SUT PLAIN 2 0 XLH (SUTURE) ×3 IMPLANT
SUT PROLENE 0 CT 1 30 (SUTURE) IMPLANT
SUT VIC AB 0 CT1 18XCR BRD 8 (SUTURE) ×3 IMPLANT
SUT VIC AB 0 CT1 18XCR BRD8 (SUTURE) IMPLANT
SUT VIC AB 0 CT1 27 (SUTURE) ×4
SUT VIC AB 0 CT1 27XBRD ANTBC (SUTURE) ×2 IMPLANT
SUT VIC AB 0 CT1 36 (SUTURE) ×18 IMPLANT
SUT VIC AB 0 CT1 8-18 (SUTURE) ×6
SUT VIC AB 2-0 CT1 27 (SUTURE) ×2
SUT VIC AB 2-0 CT1 TAPERPNT 27 (SUTURE) ×1 IMPLANT
SUT VIC AB 3-0 SH 27 (SUTURE) ×2
SUT VIC AB 3-0 SH 27XBRD (SUTURE) ×1 IMPLANT
SUT VIC AB 4-0 KS 27 (SUTURE) ×6 IMPLANT
SUT VICRYL 0 TIES 12 18 (SUTURE) ×3 IMPLANT
SYR 10ML LL (SYRINGE) ×3 IMPLANT
SYR CONTROL 10ML LL (SYRINGE) ×3 IMPLANT
TOWEL OR 17X26 10 PK STRL BLUE (TOWEL DISPOSABLE) ×6 IMPLANT

## 2018-06-28 NOTE — Brief Op Note (Signed)
06/28/2018  1:21 PM  PATIENT:  [name]  48 y.o. female  PRE-OPERATIVE DIAGNOSIS:  Symptomatic Uterine Fibroids  POST-OPERATIVE DIAGNOSIS:  Symptomatic Uterine Fibroids  PROCEDURE:  Exploratory laparotomy, total abdominal hysterectomy, bilateral salpingectomy  SURGEON:  Surgeon(s) and Role:    * Servando Salina, MD - Primary  PHYSICIAN ASSISTANT:   ASSISTANTS: Derrell Lolling, CNM   ANESTHESIA:   general Findings:  Fibroid uterus 486g. Nl ovaries, nl liver edge EBL:  25 mL   BLOOD ADMINISTERED:none  DRAINS: none   LOCAL MEDICATIONS USED:  MARCAINE     SPECIMEN:  Source of Specimen:  uterus with cervix, tubes  DISPOSITION OF SPECIMEN:  PATHOLOGY  COUNTS:  YES  TOURNIQUET:  * No tourniquets in log *  DICTATION: .Other Dictation: Dictation Number [id number]  PLAN OF CARE: Admit to inpatient   PATIENT DISPOSITION:  PACU - hemodynamically stable.   Delay start of Pharmacological VTE agent (>24hrs) due to surgical blood loss or risk of bleeding: no

## 2018-06-28 NOTE — Anesthesia Preprocedure Evaluation (Addendum)
Anesthesia Evaluation  Patient identified by MRN, date of birth, ID band Patient awake    Reviewed: Allergy & Precautions, NPO status , Patient's Chart, lab work & pertinent test results  Airway Mallampati: II  TM Distance: >3 FB Neck ROM: Full    Dental no notable dental hx. (+) Teeth Intact, Caps,    Pulmonary neg pulmonary ROS,    Pulmonary exam normal breath sounds clear to auscultation       Cardiovascular hypertension, Normal cardiovascular exam Rhythm:Regular Rate:Normal     Neuro/Psych negative neurological ROS  negative psych ROS   GI/Hepatic negative GI ROS, Neg liver ROS,   Endo/Other  Obesity  Renal/GU negative Renal ROS  negative genitourinary   Musculoskeletal negative musculoskeletal ROS (+)   Abdominal (+) + obese,   Peds  Hematology negative hematology ROS (+) anemia ,   Anesthesia Other Findings   Reproductive/Obstetrics Symptomatic uterine fibroids                            Anesthesia Physical Anesthesia Plan  ASA: II  Anesthesia Plan: General   Post-op Pain Management:    Induction: Intravenous  PONV Risk Score and Plan: 4 or greater and Scopolamine patch - Pre-op, Midazolam, Ondansetron, Dexamethasone and Treatment may vary due to age or medical condition  Airway Management Planned: Oral ETT  Additional Equipment:   Intra-op Plan:   Post-operative Plan: Extubation in OR  Informed Consent: I have reviewed the patients History and Physical, chart, labs and discussed the procedure including the risks, benefits and alternatives for the proposed anesthesia with the patient or authorized representative who has indicated his/her understanding and acceptance.     Dental advisory given  Plan Discussed with: CRNA and Surgeon  Anesthesia Plan Comments:         Anesthesia Quick Evaluation

## 2018-06-28 NOTE — Op Note (Signed)
[name], [name] MEDICAL RECORD JZ:[id number] ACCOUNT [id number] DATE OF BIRTH:December 21, 1970 FACILITY: [facility] LOCATION: [id number] PHYSICIAN:Jaimee Corum A. Shaneka Efaw, MD  OPERATIVE REPORT  DATE OF PROCEDURE:  06/28/2018  PREOPERATIVE DIAGNOSIS:  Symptomatic uterine fibroids.  PROCEDURE:  Exploratory laparotomy, total abdominal hysterectomy, bilateral salpingectomy.  POSTOPERATIVE DIAGNOSIS:  Symptomatic uterine fibroids.  ANESTHESIA:  General.  SURGEON:  Servando Salina, MD  ASSISTANTS:  Derrell Lolling, CNM  DESCRIPTION OF PROCEDURE:  Under adequate general anesthesia, the patient was placed in the supine position.  An indwelling Foley catheter was sterilely placed.  She was sterilely prepped and draped in the usual fashion.  Marcaine 0.25% was injected along the planned Pfannenstiel skin incision site.  Pfannenstiel skin incision was then made, carried down to the rectus fascia.  Rectus fascia was opened transversely.  The rectus fascia was then bluntly and sharply dissected off the rectus muscle in a  superior and inferior fashion.  The rectus muscle was split in the midline.  The parietal peritoneum was entered bluntly and extended.  Upper abdomen exploration revealed normal liver edge and normal palpable kidney.  Uterus was noted to be enlarged, containing multiple fibroids and with a large posterior fibroid below the sacral promontory.  A self-retaining Alexis retractor was placed.  The uterus was brought up into the field with a single-tooth tenaculum.  Round ligament was identified  bilaterally, suture ligated x2, and the intervening segment was opened with cautery.  The vesicouterine peritoneum was then opened transversely, and the bladder was sharply dissected off the lower uterine segment and displaced inferiorly with blunt and sharp dissection.  At that point, the fallopian tube on the right had the LigaSure serially clamped, cauterized and cut the mesosalpinx after the tube was removed.   The posterior leaf of the broad ligament was then opened, and the right uteroovarian  ligament was clamped, free tied, and then suture ligated with 0 Vicryl sutures.  The uterine vessels were then skeletonized, doubly clamped, cut, and suture ligated with 0 Vicryl suture x2.  The same procedure was performed on the contralateral side.   The cardinal ligaments were then bilaterally clamped, cut, and suture ligated until the uterosacral ligaments were reached, at which time they were isolated by being bilaterally clamped, cut, and suture ligated with 0 Vicryl.  Once the cervicovaginal junction was freed, angle clamps were placed, and the cervix was opened with sharp dissection, and circumferentially using the right angle scissors, the cervix was detached from the vagina.  The bladder was then further displaced inferiorly, and the  vaginal cuff was oversewn with 0 Vicryl running lock stitch circumferentially.  The vagina was then closed in a vertical fashion using interrupted 0 Vicryl figure-of-eight sutures.  The area was irrigated and suctioned of debris.  The vaginal angles were tied to the uterosacral ligaments bilaterally.  The round ligaments were suspended and tied to ovarian pedicles bilaterally.  Ureters was noted to be peristalsing bilaterally at the end of the case.  The pelvis was irrigated and suctioned  of debris.  Arista potato starch was sprinkled over the vaginal cuff.  The Alexis retractor was removed.  The parietal peritoneum was closed with 2-0 Vicryl.  The rectus fascia was closed with 0 Vicryl x2.  The subcutaneous area was irrigated, small bleeders cauterized.  Interrupted 2-0 plain sutures placed, and the skin approximated using 4-0 Vicryl subcuticular closure.  Steri-Strips and benzoin were then placed.  SPECIMEN:  Uterus with cervix and both fallopian tubes sent to pathology.  The specimen weighed  486 g.  COUNTS:  Sponge and instrument count x2 was correct.  COMPLICATIONS:   None.  INTRAOPERATIVE FLUIDS:  1700 mL.  URINE OUTPUT:  100 mL.  ESTIMATED BLOOD LOSS: 25 mL.  DISPOSITION:  The patient tolerated the procedure well and was transferred to recovery room in stable condition.  LN/NUANCE  D:06/28/2018 T:06/28/2018 JOB:[id number]

## 2018-06-28 NOTE — Transfer of Care (Signed)
Immediate Anesthesia Transfer of Care Note  Patient: [name]  Procedure(s) Performed: HYSTERECTOMY ABDOMINAL WITH SALPINGECTOMY (N/A Abdomen)  Patient Location: PACU  Anesthesia Type:General  Level of Consciousness: awake, oriented, drowsy and patient cooperative  Airway & Oxygen Therapy: Patient Spontanous Breathing and Patient connected to face mask oxygen  Post-op Assessment: Report given to RN, Post -op Vital signs reviewed and stable and Patient moving all extremities  Post vital signs: Reviewed and stable  Last Vitals:  Vitals Value Taken Time  BP 119/64 06/28/2018  1:37 PM  Temp    Pulse 55 06/28/2018  1:44 PM  Resp 16 06/28/2018  1:44 PM  SpO2 100 % 06/28/2018  1:44 PM  Vitals shown include unvalidated device data.  Last Pain:  Vitals:   06/28/18 0845  TempSrc: Oral         Complications: No apparent anesthesia complications

## 2018-06-28 NOTE — Anesthesia Procedure Notes (Signed)
Procedure Name: Intubation Date/Time: 06/28/2018 10:55 AM Performed by: Josephine Igo, MD Pre-anesthesia Checklist: Patient identified, Emergency Drugs available, Suction available, Patient being monitored and Timeout performed Patient Re-evaluated:Patient Re-evaluated prior to induction Oxygen Delivery Method: Circle system utilized Preoxygenation: Pre-oxygenation with 100% oxygen Induction Type: IV induction and Cricoid Pressure applied Ventilation: Mask ventilation without difficulty Laryngoscope Size: Glidescope and 4 Grade View: Grade III Tube type: Oral Tube size: 7.5 mm Number of attempts: 2 Airway Equipment and Method: Video-laryngoscopy Placement Confirmation: ETT inserted through vocal cords under direct vision,  positive ETCO2 and breath sounds checked- equal and bilateral Secured at: 23 cm Tube secured with: Tape Dental Injury: Teeth and Oropharynx as per pre-operative assessment  Difficulty Due To: Difficulty was unanticipated Future Recommendations: Recommend- induction with short-acting agent, and alternative techniques readily available Comments: Anterior. Unable to expose cords with mac 4, head lift/cricoid pressure. To glide.  Cords vis with head up, cricoid pressure.

## 2018-06-28 NOTE — Anesthesia Postprocedure Evaluation (Signed)
Anesthesia Post Note  Patient: [name]  Procedure(s) Performed: HYSTERECTOMY ABDOMINAL WITH SALPINGECTOMY (N/A Abdomen)     Patient location during evaluation: PACU Anesthesia Type: General Level of consciousness: awake and alert and oriented Pain management: pain level controlled Vital Signs Assessment: post-procedure vital signs reviewed and stable Respiratory status: spontaneous breathing, nonlabored ventilation and respiratory function stable Cardiovascular status: blood pressure returned to baseline and stable Postop Assessment: no apparent nausea or vomiting Anesthetic complications: no    Last Vitals:  Vitals:   06/28/18 1430 06/28/18 1440  BP: 120/67   Pulse: 68 65  Resp: 17 16  Temp:  (!) 36.3 C  SpO2: 100% 100%    Last Pain:  Vitals:   06/28/18 1440  TempSrc:   PainSc: Asleep                 [name] A.

## 2018-06-29 ENCOUNTER — Encounter (HOSPITAL_COMMUNITY): Payer: Self-pay | Admitting: Obstetrics and Gynecology

## 2018-06-29 LAB — BASIC METABOLIC PANEL
Anion gap: 6 (ref 5–15)
BUN: 10 mg/dL (ref 6–20)
CHLORIDE: 108 mmol/L (ref 98–111)
CO2: 21 mmol/L — ABNORMAL LOW (ref 22–32)
CREATININE: 0.81 mg/dL (ref 0.44–1.00)
Calcium: 8.8 mg/dL — ABNORMAL LOW (ref 8.9–10.3)
GFR calc Af Amer: >60 mL/min (ref >60)
GFR calc non Af Amer: >60 mL/min (ref >60)
Glucose, Bld: 135 mg/dL — ABNORMAL HIGH (ref 70–99)
POTASSIUM: 4.1 mmol/L (ref 3.5–5.1)
Sodium: 135 mmol/L (ref 135–145)

## 2018-06-29 LAB — CBC
HCT: 35.6 % — ABNORMAL LOW (ref 36.0–46.0)
Hemoglobin: 11.3 g/dL — ABNORMAL LOW (ref 12.0–15.0)
MCH: 30.4 pg (ref 26.0–34.0)
MCHC: 31.7 g/dL (ref 30.0–36.0)
MCV: 95.7 fL (ref 80.0–100.0)
PLATELETS: 235 10*3/uL (ref 150–400)
RBC: 3.72 MIL/uL — AB (ref 3.87–5.11)
RDW: 12.5 % (ref 11.5–15.5)
WBC: 9.9 10*3/uL (ref 4.0–10.5)
nRBC: 0 % (ref 0.0–0.2)

## 2018-06-29 NOTE — Progress Notes (Signed)
Subjective: Patient reports nausea and no problems voiding.    Objective: I have reviewed patient's vital signs.  vital signs, intake and output and labs. Vitals:   06/29/18 0355 06/29/18 0536  BP:  122/71  Pulse:  72  Resp: 16 16  Temp:  98.2 F (36.8 C)  SpO2: 98% 94%   I/O last 3 completed shifts: In: 2944.1 [P.O.:240; I.V.:2704.1] Out: 1175 [Urine:1000; Other:150; Blood:25] No intake/output data recorded.  Lab Results  Component Value Date   WBC 9.9 06/29/2018   HGB 11.3 (L) 06/29/2018   HCT 35.6 (L) 06/29/2018   MCV 95.7 06/29/2018   PLT 235 06/29/2018   Lab Results  Component Value Date   CREATININE 0.81 06/29/2018    EXAM General: alert, cooperative and no distress Resp: clear to auscultation bilaterally Cardio: regular rate and rhythm, S1, S2 normal, no murmur, click, rub or gallop GI: soft, non-tender; bowel sounds normal; no masses,  no organomegaly and incision: clean, dry and intact Extremities: Homans sign is negative, no sign of DVT Vaginal Bleeding: none  Assessment: s/p Procedure(s): HYSTERECTOMY ABDOMINAL WITH SALPINGECTOMY: stable and nausea resolving  Plan: Advance diet Encourage ambulation Discontinue IV fluids d/c PCA when tolerate po  LOS: 1 day    Marvene Staff, MD 06/29/2018 7:45 AM    06/29/2018, 7:45 AM

## 2018-06-30 MED ORDER — HYDROMORPHONE HCL 4 MG PO TABS: 4.0000 mg | ORAL_TABLET | ORAL | 0 refills | Status: AC | PRN

## 2018-06-30 MED ORDER — IBUPROFEN 800 MG PO TABS: 800.0000 mg | ORAL_TABLET | Freq: Four times a day (QID) | ORAL | 5 refills | Status: AC | PRN

## 2018-06-30 NOTE — Progress Notes (Signed)
PIV removed. Discharge paperwork and instructions given to pt. Pt denies questions or concerns.

## 2018-06-30 NOTE — Discharge Summary (Addendum)
Physician Discharge Summary  Patient ID: [name] MRN: [id number] DOB/AGE: May 22, 1970 48 y.o.  Admit date: 06/28/2018 Discharge date: 06/30/2018  Admission Diagnoses: symptomatic uterine fibroids  Discharge Diagnoses: symptomatic uterine fibroids Active Problems:   Status post total hysterectomy   Discharged Condition: stable  Hospital Course: pt underwent  Exp lap, TAH, bilateral salpingectomy. Uncomplicated postoperative course. Pt was tolerating  Regular diet and passing flatus. Pain controlled.  Consults: None  Significant Diagnostic Studies: labs:  CBC Latest Ref Rng & Units 06/29/2018 06/22/2018 11/07/2009  WBC 4.0 - 10.5 K/uL 9.9 5.1 10.4  Hemoglobin 12.0 - 15.0 g/dL 11.3(L) 12.7 11.6(L)  Hematocrit 36.0 - 46.0 % 35.6(L) 39.6 33.7(L)  Platelets 150 - 400 K/uL 235 279 230   BMP Latest Ref Rng & Units 06/29/2018 06/22/2018 11/05/2009  Glucose 70 - 99 mg/dL 135(H) 77 123(H)  BUN 6 - 20 mg/dL 10 11 7   Creatinine 0.44 - 1.00 mg/dL 0.81 0.78 0.63  Sodium 135 - 145 mmol/L 135 136 130(L)  Potassium 3.5 - 5.1 mmol/L 4.1 4.1 3.3(L)  Chloride 98 - 111 mmol/L 108 106 107  CO2 22 - 32 mmol/L 21(L) 25 17(L)  Calcium 8.9 - 10.3 mg/dL 8.8(L) 9.1 9.2    Treatments: surgery: exp lap, TAH, bilateral salpingectomy  Discharge Exam: Blood pressure 119/65, pulse 62, temperature 98.4 F (36.9 C), temperature source Oral, resp. rate 16, last menstrual period 06/13/2018, SpO2 99 %. General appearance: alert, cooperative and no distress Back: symmetric, no curvature. ROM normal. No CVA tenderness. Cardio: regular rate and rhythm, S1, S2 normal, no murmur, click, rub or gallop GI: soft, non-tender; bowel sounds normal; no masses,  no organomegaly Pelvic: deferred Extremities: no edema, redness or tenderness in the calves or thighs Incision/Wound: primary dressing d/c/i Pad none  Disposition: Discharge disposition: 01-Home or Self Care       Discharge Instructions    Call MD for:   persistant nausea and vomiting   Complete by:  As directed    Call MD for:  redness, tenderness, or signs of infection (pain, swelling, redness, odor or green/yellow discharge around incision site)   Complete by:  As directed    Call MD for:  severe uncontrolled pain   Complete by:  As directed    Call MD for:  temperature >100.4   Complete by:  As directed    Diet - low sodium heart healthy   Complete by:  As directed    Discharge instructions   Complete by:  As directed    Call if temperature greater than equal to 100.4, nothing per vagina for 4-6 weeks or severe nausea vomiting, increased incisional pain , drainage or redness in the incision site, no straining with bowel movements, showers no bath   May walk up steps   Complete by:  As directed      Allergies as of 06/30/2018      Reactions   Codeine Itching      Medication List    STOP taking these medications   diclofenac sodium 1 % Gel Commonly known as:  VOLTAREN   naproxen sodium 220 MG tablet Commonly known as:  ALEVE     TAKE these medications   FISH OIL PO Take 1 capsule by mouth daily.   HYDROmorphone 4 MG tablet Commonly known as:  DILAUDID Take 1 tablet (4 mg total) by mouth every 4 (four) hours as needed for up to 7 days for severe pain.   ibuprofen 800 MG tablet Commonly known as:  ADVIL,MOTRIN Take 1 tablet (800 mg total) by mouth every 6 (six) hours as needed. What changed:    when to take this  reasons to take this   multivitamin with minerals Tabs tablet Take 1 tablet by mouth daily.   TURMERIC PO Take 900 mg by mouth daily.   Vitamin D 50 MCG (2000 UT) Caps Take 2,000 Units by mouth daily.       follow up : 4 weeks at [place]. Pt has appt Signed: Timothea Bodenheimer A Yakelin Grenier 06/30/2018, 9:14 AM

## 2018-06-30 NOTE — Progress Notes (Signed)
Subjective: Patient reports tolerating PO, + flatus and no problems voiding.    Objective: I have reviewed patient's vital signs.  vital signs, labs and pathology. Vitals:   06/29/18 2220 06/30/18 0558  BP: (!) 112/58 119/65  Pulse: 63 62  Resp: 14 16  Temp: 98.7 F (37.1 C) 98.4 F (36.9 C)  SpO2: 98% 99%   I/O last 3 completed shifts: In: 3305 [P.O.:1430; I.V.:1875] Out: 2500 [Urine:2500] No intake/output data recorded.  Lab Results  Component Value Date   WBC 9.9 06/29/2018   HGB 11.3 (L) 06/29/2018   HCT 35.6 (L) 06/29/2018   MCV 95.7 06/29/2018   PLT 235 06/29/2018   Lab Results  Component Value Date   CREATININE 0.81 06/29/2018    EXAM General: alert, cooperative and no distress Resp: clear to auscultation bilaterally Cardio: regular rate and rhythm, S1, S2 normal, no murmur, click, rub or gallop GI: soft, non-tender; bowel sounds normal; no masses,  no organomegaly Extremities: no edema, redness or tenderness in the calves or thighs Vaginal Bleeding: minimal Back no CVAT Incision: primary dressing d/c/i Assessment: s/p Procedure(s): HYSTERECTOMY ABDOMINAL WITH SALPINGECTOMY: stable, progressing well and tolerating diet  Plan: Encourage ambulation Discontinue IV fluids Discharge home  F/u 4 weeks D/c instructions reviewed. Script: motrin, dilaudid  LOS: 2 days    Marvene Staff, MD 06/30/2018 9:11 AM    06/30/2018, 9:11 AM

## 2019-04-17 ENCOUNTER — Other Ambulatory Visit: Payer: Self-pay | Admitting: Family Medicine

## 2019-04-17 DIAGNOSIS — E042 Nontoxic multinodular goiter: Secondary | ICD-10-CM

## 2019-05-01 ENCOUNTER — Ambulatory Visit
Admission: RE | Admit: 2019-05-01 | Discharge: 2019-05-01 | Disposition: A | Discharge status: Home / Self Care | Source: Ambulatory Visit | Attending: Family Medicine | Admitting: Family Medicine

## 2019-05-01 DIAGNOSIS — E042 Nontoxic multinodular goiter: Secondary | ICD-10-CM

## 2019-11-12 ENCOUNTER — Other Ambulatory Visit: Payer: Self-pay | Admitting: Podiatry

## 2019-11-12 ENCOUNTER — Ambulatory Visit (INDEPENDENT_AMBULATORY_CARE_PROVIDER_SITE_OTHER): Admitting: Podiatry

## 2019-11-12 ENCOUNTER — Other Ambulatory Visit: Payer: Self-pay

## 2019-11-12 ENCOUNTER — Ambulatory Visit (INDEPENDENT_AMBULATORY_CARE_PROVIDER_SITE_OTHER)

## 2019-11-12 ENCOUNTER — Encounter: Payer: Self-pay | Admitting: Podiatry

## 2019-11-12 DIAGNOSIS — G5762 Lesion of plantar nerve, left lower limb: Secondary | ICD-10-CM | POA: Diagnosis not present

## 2019-11-12 DIAGNOSIS — G5761 Lesion of plantar nerve, right lower limb: Secondary | ICD-10-CM

## 2019-11-12 DIAGNOSIS — M779 Enthesopathy, unspecified: Secondary | ICD-10-CM

## 2019-11-12 DIAGNOSIS — G5763 Lesion of plantar nerve, bilateral lower limbs: Secondary | ICD-10-CM

## 2019-11-12 NOTE — Progress Notes (Signed)
   HPI: 49 y.o. female presenting today as a new patient for evaluation of a new since he has been going on to the bilateral feet left greater than right for the past 5 years.  Patient states that she does not necessarily feel severe pain however she feels that there is something in between the second and third toe.  It is exacerbated with walking and certain shoes.  She denies injury.  She states that this irritation has been going on for approximately 5 years now.  She presents for further treatment evaluation  Past Medical History:  Diagnosis Date  . Anemia   . Preeclampsia 8-9 yrs ago  . Uterine fibroid   . UTI (urinary tract infection) 2-3 weeks ago   resolved took antibiotics     Physical Exam: General: The patient is alert and oriented x3 in no acute distress.  Dermatology: Skin is warm, dry and supple bilateral lower extremities. Negative for open lesions or macerations.  Vascular: Palpable pedal pulses bilaterally. No edema or erythema noted. Capillary refill within normal limits.  Neurological: Epicritic and protective threshold grossly intact bilaterally.   Musculoskeletal Exam: Range of motion within normal limits to all pedal and ankle joints bilateral. Muscle strength 5/5 in all groups bilateral.  There is some slight tenderness to palpation of the second intermetatarsal space.  Pain with lateral compression of metatarsal heads with an audible click.  Positive Mulder sign consistent with a Morton's neuroma to the second intermetatarsal spaces bilateral  Radiographic Exam:  Normal osseous mineralization. Joint spaces preserved. No fracture/dislocation/boney destruction.    Assessment: 1.  Morton's neuroma second interspace bilateral   Plan of Care:  1. Patient evaluated. X-Rays reviewed.  2.  Today we discussed different treatment options.  Conservatively the patient can begin to wear wider fitting shoes that do not compress the metatarsal heads.  Also recommend naproxen  as needed.  Patient gets a prescription for naproxen from her PCP for osteoarthritis of the right hip 3.  Explained to the patient that if she does begin to get more pain related symptoms she can come in and I can provide anti-inflammatory steroid oral injections.  Ultimately surgical neurectomy if there is no alleviation of symptoms. 4.  Return to clinic as needed      Edrick Kins, DPM [facility]  Dr. Edrick Kins, DPM    [address], [place] [address]                Office [phone]  Fax [phone]

## 2019-11-20 ENCOUNTER — Telehealth: Payer: Self-pay | Admitting: Physician Assistant

## 2019-11-20 NOTE — Telephone Encounter (Signed)
Called to discuss with patient about Covid symptoms and the use of bamlanivimab/etesevimab or casirivimab/imdevimab, a monoclonal antibody infusion for those with mild to moderate Covid symptoms and at a high risk of hospitalization.  Pt is qualified for this infusion at the [facility] due to [employer] left to call back our hotline [phone] and sent my chart message.   Angelena Form PA-C  [facility]

## 2019-11-21 ENCOUNTER — Telehealth: Payer: Self-pay | Admitting: Physician Assistant

## 2019-11-21 NOTE — Telephone Encounter (Signed)
Called to discuss with Ma Hillock about Covid symptoms and the use of bamlanivimab/etesevimab or casirivimab/imdevimab, a monoclonal antibody infusion for those with mild to moderate Covid symptoms and at a high risk of hospitalization.    Pt does not qualify for infusion therapy as pt's symptoms first presented > 10 days prior to timing of infusion. Symptoms tier reviewed as well as criteria for ending isolation. Preventative practices reviewed. Patient verbalized understanding       Patient Active Problem List   Diagnosis Date Noted  . 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 HYPERTENSION 09/01/2009  . CHEST PAIN 09/01/2009    Angelena Form PA-C

## 2020-03-04 ENCOUNTER — Other Ambulatory Visit: Payer: Self-pay | Admitting: General Practice

## 2020-03-04 ENCOUNTER — Other Ambulatory Visit: Payer: Self-pay | Admitting: Family Medicine

## 2020-03-04 DIAGNOSIS — R1030 Lower abdominal pain, unspecified: Secondary | ICD-10-CM

## 2020-03-12 ENCOUNTER — Ambulatory Visit
Admission: RE | Admit: 2020-03-12 | Discharge: 2020-03-12 | Disposition: A | Discharge status: Home / Self Care | Source: Ambulatory Visit | Attending: Family Medicine | Admitting: Family Medicine

## 2020-03-12 DIAGNOSIS — R1030 Lower abdominal pain, unspecified: Secondary | ICD-10-CM

## 2020-09-08 ENCOUNTER — Telehealth: Payer: Self-pay | Admitting: Genetic Counselor

## 2020-09-08 NOTE — Telephone Encounter (Signed)
Received a genetic counseling referral from Dr. Delora Fuel for fhx of ovarian cancer. [name] has been cld and scheduled to see [name] on 6/2 at 11am. Pt aware to arrive 15 minutes early.

## 2020-09-08 NOTE — Telephone Encounter (Signed)
[name] called with questions about the cost of genetic testing. We reviewed insurance coverage of genetic testing, particularly that even if insurance covers testing, there could still be an out of pocket cost depending on her insurance plan. Discussed self-pay price of $250, as well as the option to request a benefits investigation prior to ordering the test in order to better estimate the potential out of pocket cost of testing. [name] is interested in this option - we will give her a call once the estimate is available.   We also discussed that there may be a cost for the genetic counseling appointment itself. Recommended that she call her insurance company to determine what she may need to pay for an appointment based on deductible/copay/OOP max/etc. Provided CPT code for genetic counseling appointment if her insurance needs this information.

## 2020-09-16 ENCOUNTER — Telehealth: Payer: Self-pay | Admitting: Genetic Counselor

## 2020-09-16 NOTE — Telephone Encounter (Signed)
Discussed OOP estimate for genetic testing - both [facility] and [facility] estimate that her OOP cost will be $0 for testing. Cephus Shelling initiated an authorization for genetic testing with Ms. Urschel insurance, which was approved.

## 2020-09-18 ENCOUNTER — Telehealth: Payer: Self-pay | Admitting: Genetic Counselor

## 2020-09-18 NOTE — Telephone Encounter (Signed)
Due to a provider scheduling conflict on 6/2, rescheduled genetic counseling appointment to 10/05/20 at 2pm with Brianna.

## 2020-09-24 ENCOUNTER — Inpatient Hospital Stay

## 2020-09-24 ENCOUNTER — Inpatient Hospital Stay: Admitting: Genetic Counselor

## 2020-10-05 ENCOUNTER — Encounter: Payer: Self-pay | Admitting: Licensed Clinical Social Worker

## 2020-10-05 ENCOUNTER — Inpatient Hospital Stay: Attending: Genetic Counselor | Admitting: Licensed Clinical Social Worker

## 2020-10-05 ENCOUNTER — Inpatient Hospital Stay

## 2020-10-05 ENCOUNTER — Other Ambulatory Visit: Payer: Self-pay

## 2020-10-05 DIAGNOSIS — Z8041 Family history of malignant neoplasm of ovary: Secondary | ICD-10-CM | POA: Insufficient documentation

## 2020-10-05 DIAGNOSIS — Z800 Family history of malignant neoplasm of digestive organs: Secondary | ICD-10-CM

## 2020-10-05 LAB — GENETIC SCREENING ORDER

## 2020-10-05 NOTE — Progress Notes (Signed)
REFERRING PROVIDER: Drema Dallas, DO [address] [place],  [place] [address]  PRIMARY PROVIDER:  Jonathon Jordan, MD  PRIMARY REASON FOR VISIT:  1. Family history of ovarian cancer   2. Family history of pancreatic cancer      HISTORY OF PRESENT ILLNESS:   Ms. Bains, a 50 y.o. female, was seen for a [place] cancer genetics consultation at the request of Dr. Delora Fuel due to a family history of cancer.  Ms. Blackson presents to clinic today to discuss the possibility of a hereditary predisposition to cancer, genetic testing, and to further clarify her future cancer risks, as well as potential cancer risks for family members.   Ms. Fiola is a 50 y.o. female with no personal history of cancer.    CANCER HISTORY:  Oncology History   No history exists.     RISK FACTORS:  Menarche was at age 41.  First live birth at age 17.  OCP use: yes Ovaries intact: yes.  Hysterectomy: yes.  Menopausal status: premenopausal.  HRT use: 0 years. Colonoscopy: yes;  reports few polyps . Mammogram within the last year: yes. Number of breast biopsies: 0. Up to date with pelvic exams: yes.  Past Medical History:  Diagnosis Date   Anemia    Family history of ovarian cancer    Family history of pancreatic cancer    Preeclampsia 8-9 yrs ago   Uterine fibroid    UTI (urinary tract infection) 2-3 weeks ago   resolved took antibiotics    Past Surgical History:  Procedure Laterality Date   EYE SURGERY Right    surgery for lazy eye   HYSTERECTOMY ABDOMINAL WITH SALPINGECTOMY N/A 06/28/2018   Procedure: HYSTERECTOMY ABDOMINAL WITH SALPINGECTOMY;  Surgeon: Servando Salina, MD;  Location: [facility];  Service: Gynecology;  Laterality: N/A;   PTOSIS REPAIR Right    TONSILLECTOMY      Social History   Socioeconomic History   Marital status: Married    Spouse name: Not on file   Number of children: Not on file   Years of education: Not on file   Highest education level: Not on file   Occupational History   Not on file  Tobacco Use   Smoking status: Never   Smokeless tobacco: Never  Vaping Use   Vaping Use: Not on file  Substance and Sexual Activity   Alcohol use: Yes    Comment: occasional   Drug use: No   Sexual activity: Yes    Birth control/protection: I.U.D.  Other Topics Concern   Not on file  Social History Narrative   Not on file   Social Determinants of Health   Financial Resource Strain: Not on file  Food Insecurity: Not on file  Transportation Needs: Not on file  Physical Activity: Not on file  Stress: Not on file  Social Connections: Not on file     FAMILY HISTORY:  We obtained a detailed, 4-generation family history.  Significant diagnoses are listed below: Family History  Problem Relation Age of Onset   Ovarian cancer Sister 34   Pancreatic cancer Maternal Grandmother    Colon cancer Other        mom's paternal half brother   Ms. Rake has 1 daughter. She had 2 sisters and 1 brother. Her sister, Solmon Ice, recently passed from ovarian cancer at 56. She did not have genetic testing. Her other sister did have genetic testing that was reportedly negative.   Ms. Ambrose mother is living at 79,  no cancers. Patient has 3 maternal aunts, 1 uncle, no cancers. Patient has a maternal half uncle (mom's paternal brother) who had colon cancer at 68 and died at 65. No known cancers in maternal cousins. Maternal grandmother had pancreatic at 66 and died at 54. Grandfather died at 72.  Ms. Crotty father is living at 44. Patient had 1 paternal uncle, he passed at 16, no cancer for him or for paternal cousins. Paternal grandmother passed at 36, no information about paternal grandfather.  Ms. Kinchen is aware of previous family history of genetic testing for hereditary cancer risks. Patient's maternal ancestors are of African American descent, and paternal ancestors are of African American descent. There is no reported Ashkenazi Jewish ancestry. There is no known  consanguinity.     GENETIC COUNSELING ASSESSMENT: Ms. Kelsay is a 50 y.o. female with a family history  which is somewhat suggestive of a hereditary cancer syndrome and predisposition to cancer. We, therefore, discussed and recommended the following at today's visit.   DISCUSSION: We discussed that approximately 5-10% of cancer in general is hereditary, and about 15-20% of ovarian cancer is hereditary. Most cases of hereditary ovarian/pancreatic cancer are associated with BRCA1/BRCA2 genes, although there are other genes associated with hereditary cancer as well. Cancers and risks are gene specific.  We discussed that testing is beneficial for several reasons including knowing about other cancer risks, identifying potential screening and risk-reduction options that may be appropriate, and to understand if other family members could be at risk for cancer and allow them to undergo genetic testing.   We reviewed the characteristics, features and inheritance patterns of hereditary cancer syndromes. We also discussed genetic testing, including the appropriate family members to test, the process of testing, insurance coverage and turn-around-time for results. We discussed the implications of a negative, positive and/or variant of uncertain significant result. We recommended Ms. Kalt pursue genetic testing for the [facility] Common Hereditary Cancers +RNA gene panel.   The Common Hereditary Cancers Panel + RNA offered by [facility] includes sequencing and/or deletion duplication testing of the following 47 genes: APC, ATM, AXIN2, BARD1, BMPR1A, BRCA1, BRCA2, BRIP1, CDH1, CDKN2A (p14ARF), CDKN2A (p16INK4a), CKD4, CHEK2, CTNNA1, DICER1, EPCAM (Deletion/duplication testing only), GREM1 (promoter region deletion/duplication testing only), KIT, MEN1, MLH1, MSH2, MSH3, MSH6, MUTYH, NBN, NF1, NHTL1, PALB2, PDGFRA, PMS2, POLD1, POLE, PTEN, RAD50, RAD51C, RAD51D, SDHB, SDHC, SDHD, SMAD4, SMARCA4. STK11, TP53, TSC1, TSC2, and  VHL.  The following genes were evaluated for sequence changes only: SDHA and HOXB13 c.251G>A variant only.  Based on Ms. Barkow's family history of cancer, she meets medical criteria for genetic testing. Despite that she meets criteria, she may still have an out of pocket cost. We discussed that if her out of pocket cost for testing is over $100, the laboratory will call and confirm whether she wants to proceed with testing.  If the out of pocket cost of testing is less than $100 she will be billed by the genetic testing laboratory.   We discussed that some people do not want to undergo genetic testing due to fear of genetic discrimination.  A federal law called the Genetic Information Non-Discrimination Act (GINA) of 2008 helps protect individuals against genetic discrimination based on their genetic test results.  It impacts both health insurance and employment.  For health insurance, it protects against increased premiums, being kicked off insurance or being forced to take a test in order to be insured.  For employment it protects against hiring, firing and promoting decisions based  on genetic test results.  Health status due to a cancer diagnosis is not protected under GINA.  This law does not protect life insurance, disability insurance, or other types of insurance.   PLAN: After considering the risks, benefits, and limitations, Ms. Huot provided informed consent to pursue genetic testing and the blood sample was sent to [facility] for analysis of the Common Hereditary Cancers Panel+RNA. Results should be available within approximately 2-3 weeks' time, at which point they will be disclosed by telephone to Ms. Cleere, as will any additional recommendations warranted by these results. Ms. Yim will receive a summary of her genetic counseling visit and a copy of her results once available. This information will also be available in Epic.   Ms. Morreale questions were answered to her satisfaction today.  Our contact information was provided should additional questions or concerns arise. Thank you for the referral and allowing [place] to share in the care of your patient.   Faith Rogue, MS, [facility] Genetic Counselor [email or web address] Phone: [phone]  The patient was seen for a total of 30 minutes in face-to-face genetic counseling. Patient was seen alone. Drs. Magrinat/Gudena/and/or Burr Medico were available for discussion regarding this case.   _______________________________________________________________________ For Office Staff:  Number of people involved in session: 1 Was an Intern/ student involved with case: no

## 2020-10-20 ENCOUNTER — Telehealth: Payer: Self-pay | Admitting: Licensed Clinical Social Worker

## 2020-10-20 ENCOUNTER — Encounter: Payer: Self-pay | Admitting: Licensed Clinical Social Worker

## 2020-10-20 ENCOUNTER — Ambulatory Visit: Payer: Self-pay | Admitting: Licensed Clinical Social Worker

## 2020-10-20 DIAGNOSIS — Z1379 Encounter for other screening for genetic and chromosomal anomalies: Secondary | ICD-10-CM

## 2020-10-20 DIAGNOSIS — Z800 Family history of malignant neoplasm of digestive organs: Secondary | ICD-10-CM

## 2020-10-20 DIAGNOSIS — Z8041 Family history of malignant neoplasm of ovary: Secondary | ICD-10-CM

## 2020-10-20 NOTE — Telephone Encounter (Signed)
Revealed negative genetic testing.  This normal result is reassuring.  It is unlikely that there is an increased risk of cancer due to a mutation in one of these genes.  However, genetic testing is not perfect, and cannot definitively rule out a hereditary cause.  It will be important for her to keep in contact with genetics to learn if any additional testing may be needed in the future.      

## 2020-10-20 NOTE — Progress Notes (Signed)
HPI:  Ms. Pals was previously seen in the [place] [facility] due to a family history of cancer and concerns regarding a hereditary predisposition to cancer. Please refer to our prior cancer genetics clinic note for more information regarding our discussion, assessment and recommendations, at the time. Ms. Vessey recent genetic test results were disclosed to her, as were recommendations warranted by these results. These results and recommendations are discussed in more detail below.  CANCER HISTORY:  Oncology History   No history exists.    FAMILY HISTORY:  We obtained a detailed, 4-generation family history.  Significant diagnoses are listed below: Family History  Problem Relation Age of Onset   Ovarian cancer Sister 25   Pancreatic cancer Maternal Grandmother    Colon cancer Other        mom's paternal half brother    Ms. Kelemen has 1 daughter. She had 2 sisters and 1 brother. Her sister, Solmon Ice, recently passed from ovarian cancer at 47. She did not have genetic testing. Her other sister did have genetic testing that was reportedly negative.   Ms. Grippi mother is living at 7, no cancers. Patient has 3 maternal aunts, 1 uncle, no cancers. Patient has a maternal half uncle (mom's paternal brother) who had colon cancer at 84 and died at 48. No known cancers in maternal cousins. Maternal grandmother had pancreatic at 79 and died at 23. Grandfather died at 88.   Ms. Lehrmann father is living at 15. Patient had 1 paternal uncle, he passed at 43, no cancer for him or for paternal cousins. Paternal grandmother passed at 6, no information about paternal grandfather.   Ms. Kram is aware of previous family history of genetic testing for hereditary cancer risks. Patient's maternal ancestors are of African American descent, and paternal ancestors are of African American descent. There is no reported Ashkenazi Jewish ancestry. There is no known consanguinity.     GENETIC TEST RESULTS:  Genetic testing reported out on 10/19/2020 through the [facility] Common Hereditary Cancers+RNA cancer panel found no pathogenic mutations.   The Common Hereditary Cancers Panel + RNA offered by [facility] includes sequencing and/or deletion duplication testing of the following 47 genes: APC, ATM, AXIN2, BARD1, BMPR1A, BRCA1, BRCA2, BRIP1, CDH1, CDKN2A (p14ARF), CDKN2A (p16INK4a), CKD4, CHEK2, CTNNA1, DICER1, EPCAM (Deletion/duplication testing only), GREM1 (promoter region deletion/duplication testing only), KIT, MEN1, MLH1, MSH2, MSH3, MSH6, MUTYH, NBN, NF1, NHTL1, PALB2, PDGFRA, PMS2, POLD1, POLE, PTEN, RAD50, RAD51C, RAD51D, SDHB, SDHC, SDHD, SMAD4, SMARCA4. STK11, TP53, TSC1, TSC2, and VHL.  The following genes were evaluated for sequence changes only: SDHA and HOXB13 c.251G>A variant only.  The test report has been scanned into EP[employer] and is located under the Molecular Pathology section of the Results Review tab.  A portion of the result report is included below for reference.     We discussed that because current genetic testing is not perfect, it is possible there may be a gene mutation in one of these genes that current testing cannot detect, but that chance is small.  There could be another gene that has not yet been discovered, or that we have not yet tested, that is responsible for the cancer diagnoses in the family. It is also possible there is a hereditary cause for the cancer in the family that Ms. Top did not inherit and therefore was not identified in her testing.  Therefore, it is important to remain in touch with cancer genetics in the future so that we can continue to offer  Ms. Pellegrino the most up to date genetic testing.   ADDITIONAL GENETIC TESTING: We discussed with Ms. Tukes that her genetic testing was fairly extensive.  If there are genes identified to increase cancer risk that can be analyzed in the future, we would be happy to discuss and coordinate this testing at that time.    CANCER  SCREENING RECOMMENDATIONS: Ms. Saltos test result is considered negative (normal).  This means that we have not identified a hereditary cause for her family history of cancer at this time.   While reassuring, this does not definitively rule out a hereditary predisposition to cancer. It is still possible that there could be genetic mutations that are undetectable by current technology. There could be genetic mutations in genes that have not been tested or identified to increase cancer risk.  Therefore, it is recommended she continue to follow the cancer management and screening guidelines provided by her primary healthcare provider.   An individual's cancer risk and medical management are not determined by genetic test results alone. Overall cancer risk assessment incorporates additional factors, including personal medical history, family history, and any available genetic information that may result in a personalized plan for cancer prevention and surveillance.  Based on Ms. Wilsey's personal and family history of cancer as well as her genetic test results, risk model Harriett Rush was used to estimate her risk of developing breast cancer. This estimates her lifetime risk of developing breast cancer to be approximately 13%.  It may be higher as we did not have density information to include. The patient's lifetime breast cancer risk is a preliminary estimate based on available information using one of several models endorsed by the [place] (ACS). The ACS recommends consideration of breast MRI screening as an adjunct to mammography for patients at high risk (defined as 20% or greater lifetime risk).    RECOMMENDATIONS FOR FAMILY MEMBERS:  Relatives in this family might be at some increased risk of developing cancer, over the general population risk, simply due to the family history of cancer.  We recommended female relatives in this family have a yearly mammogram beginning at age 59, or 34 years  younger than the earliest onset of cancer, an annual clinical breast exam, and perform monthly breast self-exams. Female relatives in this family should also have a gynecological exam as recommended by their primary provider.  All family members should be referred for colonoscopy starting at age 46.    It is also possible there is a hereditary cause for the cancer in Ms. Lemonds family that she did not inherit and therefore was not identified in her.  Based on Ms. Concepcion's family history, we recommended her siblings/maternal relatives have genetic counseling and testing. Ms. Fratus will let us know if we can be of any assistance in coordinating genetic counseling and/or testing for these family members.  FOLLOW-UP: Lastly, we discussed with Ms. Lashway that cancer genetics is a rapidly advancing field and it is possible that new genetic tests will be appropriate for her and/or her family members in the future. We encouraged her to remain in contact with cancer genetics on an annual basis so we can update her personal and family histories and let her know of advances in cancer genetics that may benefit this family.   Our contact number was provided. Ms. Knab questions were answered to her satisfaction, and she knows she is welcome to call us at anytime with additional questions or concerns.   Faith Rogue, MS, [place]  Journalist, [email or web address] Phone: [phone]

## 2023-02-06 ENCOUNTER — Other Ambulatory Visit (HOSPITAL_BASED_OUTPATIENT_CLINIC_OR_DEPARTMENT_OTHER): Payer: Self-pay

## 2023-02-06 MED ORDER — WEGOVY 2.4 MG/0.75ML ~~LOC~~ SOAJ: 2.4000 mg | SUBCUTANEOUS | 5 refills | Status: AC

## 2023-04-17 ENCOUNTER — Other Ambulatory Visit (HOSPITAL_BASED_OUTPATIENT_CLINIC_OR_DEPARTMENT_OTHER): Payer: Self-pay

## 2023-05-23 ENCOUNTER — Other Ambulatory Visit: Payer: Self-pay | Admitting: Family Medicine

## 2023-05-23 DIAGNOSIS — R1032 Left lower quadrant pain: Secondary | ICD-10-CM

## 2023-05-31 ENCOUNTER — Encounter: Payer: Self-pay | Admitting: Radiology

## 2023-05-31 ENCOUNTER — Ambulatory Visit
Admission: RE | Admit: 2023-05-31 | Discharge: 2023-05-31 | Disposition: A | Discharge status: Home / Self Care | Source: Ambulatory Visit | Attending: Family Medicine

## 2023-05-31 DIAGNOSIS — R1032 Left lower quadrant pain: Secondary | ICD-10-CM

## 2023-05-31 MED ORDER — IOPAMIDOL (ISOVUE-300) INJECTION 61%
100.0000 mL | Freq: Once | INTRAVENOUS | Status: AC | PRN
  Administered 2023-05-31: 100 mL via INTRAVENOUS
